# Patient Record
Sex: Female | Born: 1990 | Race: White | Hispanic: Yes | Marital: Married | Smoking: Never smoker
Health system: Southern US, Community
[De-identification: ages and names within clinical notes are randomized; demographics above are authoritative.]

## PROBLEM LIST (undated history)

## (undated) DIAGNOSIS — O24419 Gestational diabetes mellitus in pregnancy, unspecified control: Secondary | ICD-10-CM

## (undated) HISTORY — PX: NO PAST SURGERIES: SHX2092

---

## 2008-04-18 ENCOUNTER — Inpatient Hospital Stay (HOSPITAL_COMMUNITY): Admission: AD | Admit: 2008-04-18 | Discharge: 2008-04-18 | Payer: Self-pay | Admitting: Obstetrics & Gynecology

## 2008-06-13 ENCOUNTER — Ambulatory Visit (HOSPITAL_COMMUNITY): Admission: RE | Admit: 2008-06-13 | Discharge: 2008-06-13 | Payer: Self-pay | Admitting: Obstetrics & Gynecology

## 2008-10-19 ENCOUNTER — Ambulatory Visit: Payer: Self-pay | Admitting: Family Medicine

## 2008-10-19 ENCOUNTER — Inpatient Hospital Stay (HOSPITAL_COMMUNITY): Admission: AD | Admit: 2008-10-19 | Discharge: 2008-10-22 | Payer: Self-pay | Admitting: Obstetrics and Gynecology

## 2008-10-19 ENCOUNTER — Inpatient Hospital Stay (HOSPITAL_COMMUNITY): Admission: AD | Admit: 2008-10-19 | Discharge: 2008-10-19 | Payer: Self-pay | Admitting: Obstetrics & Gynecology

## 2009-08-31 ENCOUNTER — Emergency Department (HOSPITAL_COMMUNITY): Admission: EM | Admit: 2009-08-31 | Discharge: 2009-08-31 | Payer: Self-pay | Admitting: Emergency Medicine

## 2010-08-02 LAB — CBC
HCT: 35.1 % — ABNORMAL LOW (ref 36.0–46.0)
MCHC: 32.7 g/dL (ref 30.0–36.0)
MCV: 76.9 fL — ABNORMAL LOW (ref 78.0–100.0)
RBC: 4.57 MIL/uL (ref 3.87–5.11)

## 2011-01-28 LAB — URINALYSIS, ROUTINE W REFLEX MICROSCOPIC
Bilirubin Urine: NEGATIVE
Specific Gravity, Urine: 1.01 (ref 1.005–1.030)
Urobilinogen, UA: 0.2 mg/dL (ref 0.0–1.0)

## 2011-01-28 LAB — CBC
Hemoglobin: 13.5 g/dL (ref 12.0–15.0)
MCV: 81.9 fL (ref 78.0–100.0)
RBC: 4.95 MIL/uL (ref 3.87–5.11)
WBC: 9.2 10*3/uL (ref 4.0–10.5)

## 2011-01-28 LAB — URINE MICROSCOPIC-ADD ON

## 2011-01-28 LAB — POCT PREGNANCY, URINE: Preg Test, Ur: POSITIVE

## 2011-11-21 IMAGING — CT CT CERVICAL SPINE W/O CM
2 of 3 series · 8 of 14 positions shown, 9 images · non-contrast
Comparison: None.
COMPARISON: None

CLINICAL DATA: Headache.  Right facial and right arm numbness.

CT HEAD WITHOUT CONTRAST
TECHNIQUE: Contiguous axial images were obtained from the base of
the skull through the vertex without contrast
CLINICAL DATA: Headache.  Right face and arm numbness.
CT CERVICAL SPINE WITHOUT CONTRAST
TECHNIQUE: Multidetector CT imaging of the cervical spine was
performed without intravenous contrast.  Multiplanar CT image
reconstructions were also generated.

[Series 6: c_spine 2.0 b31s detail · axial · 0.30mm/px · z∈[+1026,+1134]mm · 4 of 92 slices shown]
[im 19/92  bone]
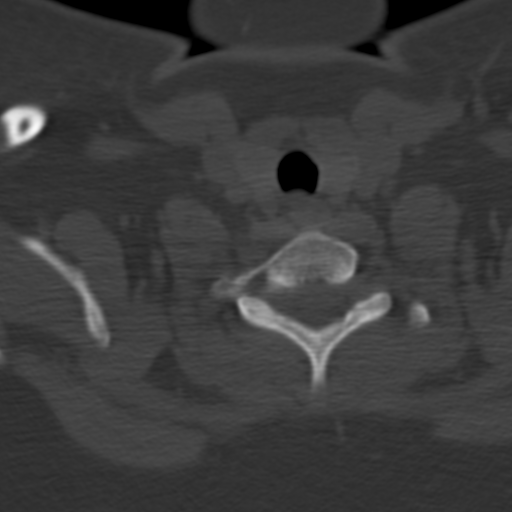
[im 37/92  bone]
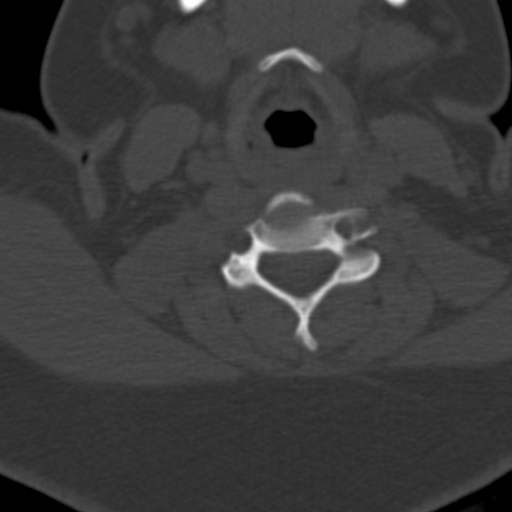
[im 55/92  bone]
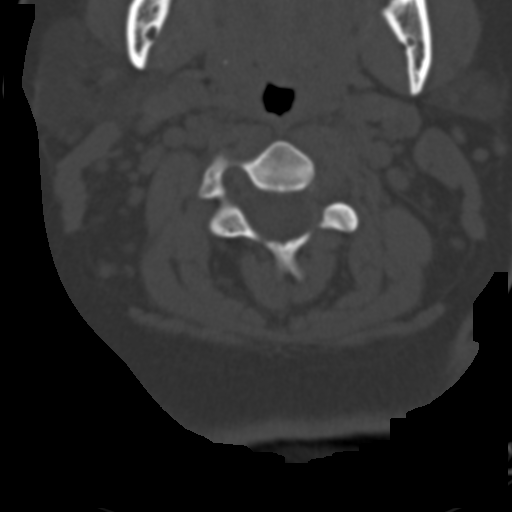
[im 73/92  bone]
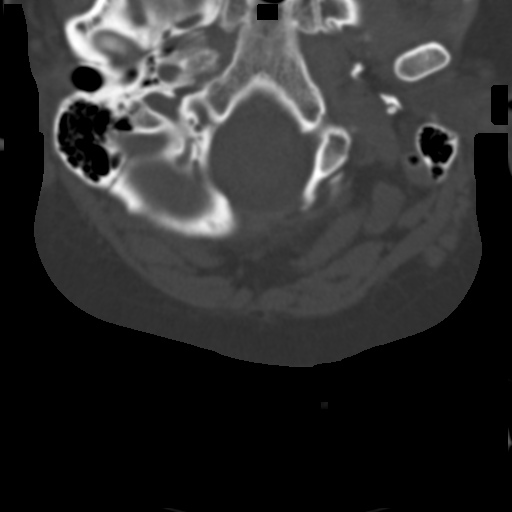

[Series 10: c_spine 2.0 spo thins · axial · 0.43mm/px · z∈[+980,+1069]mm · 4 of 83 slices shown, 5 images]
[im 17/83  soft-tissue]
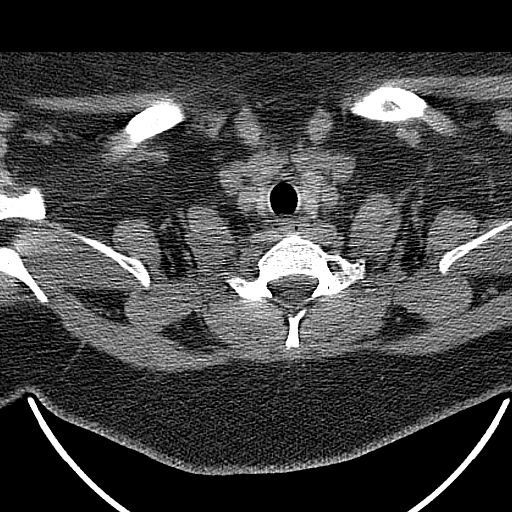
[im 17/83  bone]
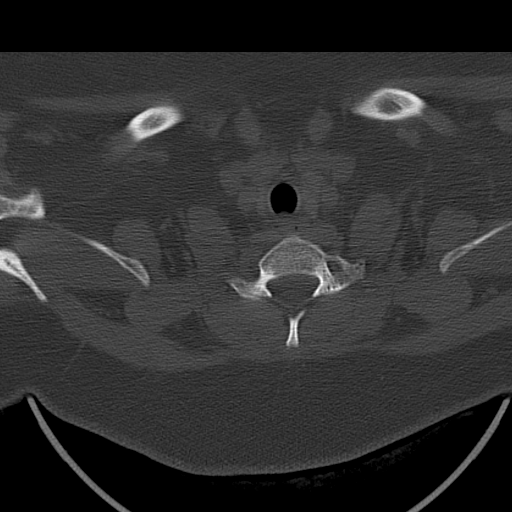
[im 33/83  bone]
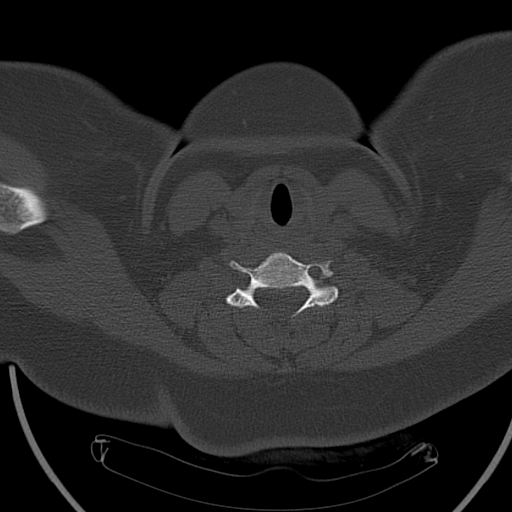
[im 50/83  bone]
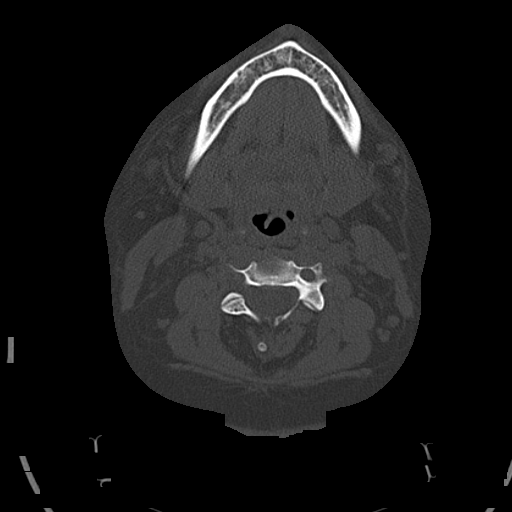
[im 66/83  bone]
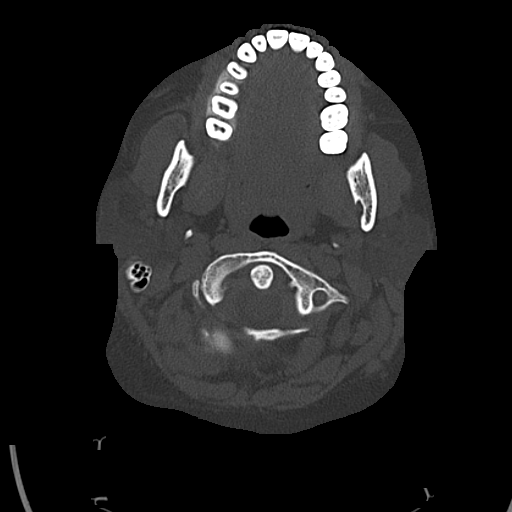

[8 of 14 positions shown; findings below may reference images not displayed]

FINDINGS: The brain has a normal appearance without evidence for
hemorrhage, acute infarction, hydrocephalus, or mass lesion.  There
is no extra axial fluid collection.  The skull and paranasal
sinuses are normal.
IMPRESSION: Normal CT of the head without contrast.
FINDINGS: There is straightening of the cervical lordosis with
mild kyphosis present.  Normal alignment.  Negative for fracture.
No significant degenerative change is identified.  There is no
fracture or mass lesion.
IMPRESSION: Negative

## 2012-07-31 ENCOUNTER — Encounter: Payer: Self-pay | Admitting: Obstetrics & Gynecology

## 2012-09-03 ENCOUNTER — Encounter: Payer: Self-pay | Admitting: Obstetrics & Gynecology

## 2013-11-14 ENCOUNTER — Ambulatory Visit: Payer: Self-pay

## 2014-01-01 ENCOUNTER — Ambulatory Visit (INDEPENDENT_AMBULATORY_CARE_PROVIDER_SITE_OTHER): Payer: Self-pay | Admitting: Gynecology

## 2014-01-01 ENCOUNTER — Encounter: Payer: Self-pay | Admitting: Gynecology

## 2014-01-01 VITALS — BP 112/76 | Ht 63.5 in | Wt 227.0 lb

## 2014-01-01 DIAGNOSIS — R51 Headache: Secondary | ICD-10-CM

## 2014-01-01 DIAGNOSIS — N912 Amenorrhea, unspecified: Secondary | ICD-10-CM

## 2014-01-01 DIAGNOSIS — R635 Abnormal weight gain: Secondary | ICD-10-CM

## 2014-01-01 DIAGNOSIS — O24419 Gestational diabetes mellitus in pregnancy, unspecified control: Secondary | ICD-10-CM | POA: Insufficient documentation

## 2014-01-01 DIAGNOSIS — L680 Hirsutism: Secondary | ICD-10-CM

## 2014-01-01 DIAGNOSIS — R519 Headache, unspecified: Secondary | ICD-10-CM | POA: Insufficient documentation

## 2014-01-01 LAB — HEMOGLOBIN A1C
Hgb A1c MFr Bld: 5.8 % — ABNORMAL HIGH (ref <5.7)
MEAN PLASMA GLUCOSE: 120 mg/dL — AB (ref <117)

## 2014-01-01 LAB — PREGNANCY, URINE: PREG TEST UR: NEGATIVE

## 2014-01-01 LAB — TSH: TSH: 4.475 u[IU]/mL (ref 0.350–4.500)

## 2014-01-01 MED ORDER — MEDROXYPROGESTERONE ACETATE 10 MG PO TABS: 10.0000 mg | ORAL_TABLET | Freq: Every day | ORAL | Status: DC

## 2014-01-01 NOTE — Progress Notes (Signed)
   The patient is a 23 year old gravida 1 para 1 who presents to the office today with a complaint of amenorrhea, weight gain, abnormal hair growth, and daily headaches. Patient stated that since the birth of her last child 5 years ago she has only had 5 menstrual cycles. She had been receiving [facility] but no blood tests her evaluation has ever been done and this is the reason for today's visit. She denies any galactorrhea or any double vision. She reports her Pap smear has been less than a year ago and was normal. She has not been using any form of contraception she was wanting to get pregnant shortly after the birth of her last child.  Exam: Patient weighs 227 pounds with a BMI of 39.58 blood pressure 112/76 HEENT: Patient with coarse hairs underneath her chin Pelvic: Bartholin urethra Skene glands within normal limits Vagina: No lesions or discharge Cervix no lesions or discharge Uterus upper limits of normal no palpable masses or tenderness Adnexa: No palpable mass or tenderness Rectal exam: Not done  Assessment/plan: Overweight patient with oligomenorrhea, daily headaches, and weight gain and hirsutism will be evaluated to rule out adrenal hyperplasia as well as hyperprolactinemia and/or hypothyroidism and diabetes. The following labs were ordered today: Urine pregnancy test (negative), hemoglobin A1c, TSH, serum prolactin level, DHEAS, total testosterone, and 17 hydroxyprogesterone. She will be prescribed Provera 10 mg to take 1 by mouth daily for 7 days. She will return back to the office in 2 weeks for followup unless these results are abnormal.

## 2014-01-02 LAB — TESTOSTERONE: TESTOSTERONE: 48 ng/dL (ref 10–70)

## 2014-01-02 LAB — PROLACTIN: PROLACTIN: 6.9 ng/mL

## 2014-01-04 LAB — DHEA: DHEA: 365 ng/dL (ref 102–1185)

## 2014-01-05 LAB — 17-HYDROXYPROGESTERONE: 17-OH-Progesterone, LC/MS/MS: 23 ng/dL

## 2014-01-14 ENCOUNTER — Ambulatory Visit: Payer: Self-pay | Admitting: Gynecology

## 2014-01-21 ENCOUNTER — Encounter: Payer: Self-pay | Admitting: Gynecology

## 2014-01-21 ENCOUNTER — Ambulatory Visit (INDEPENDENT_AMBULATORY_CARE_PROVIDER_SITE_OTHER): Payer: Self-pay | Admitting: Gynecology

## 2014-01-21 VITALS — BP 124/80

## 2014-01-21 DIAGNOSIS — N915 Oligomenorrhea, unspecified: Secondary | ICD-10-CM

## 2014-01-21 DIAGNOSIS — E282 Polycystic ovarian syndrome: Secondary | ICD-10-CM

## 2014-01-21 MED ORDER — CLOMIPHENE CITRATE 50 MG PO TABS: ORAL_TABLET | ORAL | Status: DC

## 2014-01-21 MED ORDER — MEDROXYPROGESTERONE ACETATE 10 MG PO TABS: 10.0000 mg | ORAL_TABLET | Freq: Every day | ORAL | Status: DC

## 2014-01-21 MED ORDER — METFORMIN HCL 500 MG PO TABS: 500.0000 mg | ORAL_TABLET | Freq: Every day | ORAL | Status: DC

## 2014-01-21 NOTE — Patient Instructions (Signed)
Metformin tablets Qu es este medicamento? La METFORMINA se usa para tratar la diabetes tipo 2. Ayuda a controlar el nivel de azcar en la sangre. El tratamiento se combina con ejercicios y una dieta. Este medicamento se puede usar solo o con otros medicamentos para la diabetes, incluyendo la insulina. Este medicamento puede ser utilizado para otros usos; si tiene alguna pregunta consulte con su proveedor de atencin mdica o con su farmacutico. MARCAS COMERCIALES DISPONIBLES: Glucophage Qu le debo informar a mi profesional de la salud antes de tomar este medicamento? Necesita saber si usted presenta alguno de los siguientes problemas o situaciones: -anemia -si consume bebidas alcohlicas con frecuencia -se deshidrata con facilidad -ataque cardiaco -insuficiencia cardiaca tratada con medicamentos -enfermedad renal -enfermedad heptica -ovarios poliqusticos -infeccin o lesin severa -vmito -una reaccin alrgica o inusual a la metformina, a otros medicamentos, alimentos, colorantes o conservantes -si est embarazada o buscando quedar embarazada -si est amamantando a un beb Cmo debo utilizar este medicamento? Tome este medicamento por va oral. Tmelo con las comidas. Trague las tabletas con un vaso de agua. Siga las instrucciones de la etiqueta del medicamento. Tome sus dosis a intervalos regulares. No tome su medicamento con una frecuencia mayor a la indicada. Hable con su pediatra para informarse acerca del uso de este medicamento en nios. Aunque este medicamento se puede recetar a nios tan menores como de 10 aos de edad para condiciones selectivas, las precauciones se aplican. Sobredosis: Pngase en contacto inmediatamente con un centro toxicolgico o una sala de urgencia si usted cree que haya tomado demasiado medicamento. ATENCIN: Este medicamento es solo para usted. No comparta este medicamento con nadie. Qu sucede si me olvido de una dosis? Si olvida una dosis, tmela  lo antes posible. Si es casi la hora de su dosis siguiente, tome slo esa dosis. No tome dosis adicionales o dobles. Qu puede interactuar con este medicamento? No tome esta medicina con ninguno de los siguientes medicamentos: -dofetilida -gatifloxacino -ciertos agentes de contraste administrados antes de un procedimiento con rayos X, tomografas computadas (CT), MRI u otros procedimientos Muchos medicamentos pueden aumentar o reducir el nivel de azcar en la sangre, tales como: -digoxina -diurticos -hormonas femeninas, como estrgenos, progestinas o pldoras anticonceptivas -isoniazida -medicamentos para presin sangunea, enfermedad cardiaca, pulso cardiaco irregular -morfina -cido nicotnico -fenotiazinas, tales como clorpromacina, mesoridazina, proclorperazina, tioridazina -fenitona -procainamida -quinidina -quinina -ranitidina -medicamentos esteroideos, como la prednisona o la cortisona -medicamentos estimulantes para trastornos de atencin, perder peso o mantenerse despierto -medicamentos tiroideos -trimetoprima -vancomicina Puede ser que esta lista no menciona todas las posibles interacciones. Informe a su profesional de la salud de todos los productos a base de hierbas, medicamentos de venta libre o suplementos nutritivos que est tomando. Si usted fuma, consume bebidas alcohlicas o si utiliza drogas ilegales, indqueselo tambin a su profesional de la salud. Algunas sustancias pueden interactuar con su medicamento. A qu debo estar atento al usar este medicamento? Visite a su mdico o a su profesional de la salud para chequear su evolucin peridicamente. Un examen llamado HbA1C (A1C) ser monitoreado. Es un simple examen de sangre. Mide su control de azcar en la sangre durante los ltimos 2 a 3 meses. Usted recibir este examen cada 3 a 6 meses. Aprenda cmo controlar el nivel de azcar en la sangre. Aprenda a reconocer los sntomas de bajo y alto nivel de azcar en la  sangre y cmo tratarlos. Siempre lleve consigo una fuente rpida de azcar por si acaso experimenta sntomas de bajo nivel de azcar en   la sangre. Ejemplos incluyen caramelos duros o tabletas de glucosa. Asegrese de que los miembros de su familia sepan que se puede ahogar si come o bebe mientras tiene sntomas graves de bajo nivel de azcar en la sangre, tales como convulsiones o prdida del conocimiento. Deben obtener ayuda mdica inmediatamente. Informe a su mdico o a su profesional de la salud si tiene alto nivel de azcar en la sangre. Tal vez sea necesario cambiar la dosis de su medicamento. Si est enfermo o haciendo mucho ms ejercicio que el habitual, puede ser necesario cambiar la dosis de su medicamento. No se salte comidas. Pregunte a su mdico o a su profesional de la salud si debe evitar el consumo de alcohol. Muchos productos de venta libre para tos y resfros contienen azcar y alcohol. Estos pueden afectar el nivel de azcar en la sangre. Este medicamento puede provocar la ovulacin en mujeres premenopusicas que no tienen periodos menstruales regulares. Esto puede aumentar la posibilidad de quedarse embarazada. No debe tomar este medicamento si se queda embarazada o si cree que est embarazada. Consulte a su mdico o su profesional de la salud sobre sus opciones anticonceptivas mientras est tomando este medicamento. Si cree que est embarazada, consulte a su mdico o su profesional de la salud inmediatamente. Si va a someterse a una operacin, IRM (MRI), tomografa computarizada u otro procedimiento, informe a su mdico que est tomando este medicamento. Usted podr necesitar dejar de tomar este medicamento antes del procedimiento. Use una pulsera o cadena de identificacin mdica. Lleve consigo una tarjeta de identificacin con informacin sobre su enfermedad y detalles de sus medicamentos y los horarios de las dosis. Qu efectos secundarios puedo tener al utilizar este  medicamento? Efectos secundarios que debe informar a su mdico o a su profesional de la salud tan pronto como sea posible: -reacciones alrgicas como erupcin cutnea, picazn o urticarias, hinchazn de la cara, labios o lengua -problemas respiratorios -sensacin de desmayos o aturdimiento, cadas -dolores o molestias musculares -signos o sntomas de bajo nivel de azcar en la sangre tales como sentirse ansioso, confusin, mareos, aumento de apetito, debilidad o cansancio inusual, sudoracin, temblores, fro, irritabilidad, dolor de cabeza, visin borrosa, pulso cardaco rpido, prdida del conocimiento -pulso cardiaco irregular o lento -molestias o dolor de estmago inusual -cansancio o debilidad inusual Efectos secundarios que, por lo general, no requieren atencin mdica (debe informarlos a su mdico o a su profesional de la salud si persisten o si son molestos): -diarrea -dolor de cabeza -acidez de estmago -sabor metlico en la boca -nuseas -molestias estomacales, gases Puede ser que esta lista no menciona todos los posibles efectos secundarios. Comunquese a su mdico por asesoramiento mdico sobre los efectos secundarios. Usted puede informar los efectos secundarios a la FDA por telfono al 1-800-FDA-1088. Dnde debo guardar mi medicina? Mantngala fuera del alcance de los nios. Gurdela a temperatura ambiente, entre 15 y 30 grados C (59 y 86 grados F). Protjala de la humedad y de la luz. Deseche todo el medicamento que no haya utilizado, despus de la fecha de vencimiento. ATENCIN: Este folleto es un resumen. Puede ser que no cubra toda la posible informacin. Si usted tiene preguntas acerca de esta medicina, consulte con su mdico, su farmacutico o su profesional de la salud.  2015, [employer]. (2013-02-27 16:15:26) Clomiphene tablets Qu es este medicamento? El CLOMIFENO es un medicamento para la fertilidad que se utiliza para aumentar la posibilidad de quedar  embarazada. Se usa para estimular una ovulacin (producir un huevo maduro) adecuada   durante el ciclo de la mujer. Este medicamento puede ser utilizado para otros usos; si tiene alguna pregunta consulte con su proveedor de atencin mdica o con su farmacutico. MARCAS COMERCIALES DISPONIBLES: Clomid, Serophene Qu le debo informar a mi profesional de la salud antes de tomar este medicamento? Necesita saber si usted presenta alguno de los siguientes problemas o situaciones: -enfermedad de la glndula suprarrenal -enfermedad vascular, trastorno de coagulacin sangunea -quiste en los ovarios -endometriosis -enfermedad heptica -carcinoma de ovario -enfermedad de la glndula pituitaria -sangrado vaginal que no ha sido evaluado -una reaccin alrgica o inusual al clomifeno, a otros medicamentos, alimentos, colorantes o conservantes -si est embarazada (no debe usar si est embarazada) -si est amamantando a un beb Cmo debo utilizar este medicamento? Tome este medicamento por va oral con un vaso de agua. Siga las instrucciones de la etiqueta del medicamento. Tomar exactamente segn se indica y durante el nmero exacto de das para los que fue recetado. Tome sus dosis a intervalos regulares. La mayora de las mujeres toman este medicamento durante un perodo de 5 das, pero la duracin del tratamiento puede ajustarse en algunos casos. Su mdico le indicarn el da en que debe empezar a tomar este medicamento y le darn las indicaciones para el seguimiento. No tome su medicamento con una frecuencia mayor que la indicada. Hable con su pediatra para informarse acerca del uso de este medicamento en nios. Puede requerir atencin especial. Sobredosis: Pngase en contacto inmediatamente con un centro toxicolgico o una sala de urgencia si usted cree que haya tomado demasiado medicamento. ATENCIN: Este medicamento es solo para usted. No comparta este medicamento con nadie. Qu sucede si me olvido de  una dosis? Si olvida una dosis, tmela lo antes posible. Si es casi la hora de la prxima dosis, tome slo esa dosis. No tome dosis adicionales o dobles. Qu puede interactuar con este medicamento? -suplementos a base de hierbas o dietticos como cohosh azul, cohosh negro, vitex o DHEA -prasterona Puede ser que esta lista no menciona todas las posibles interacciones. Informe a su profesional de la salud de todos los productos a base de hierbas, medicamentos de venta libre o suplementos nutritivos que est tomando. Si usted fuma, consume bebidas alcohlicas o si utiliza drogas ilegales, indqueselo tambin a su profesional de la salud. Algunas sustancias pueden interactuar con su medicamento. A qu debo estar atento al usar este medicamento? Asegrese que usted sepa cmo y cundo usar este medicamento. Debe saber cundo est ovulando y cundo debe tener relaciones sexuales a fin de incrementar las posibilidades de quedar embarazada. Visite a su mdico o a su profesional de la salud para chequear su evolucin peridicamente. Es posible que deba controlar sus niveles de hormonas en la sangre o que le indique alguna prueba de orina domiciliaria para controlar la ovulacin. Trate de no faltar a las citas. En comparacin con otros tratamientos para la fertilidad, este medicamento no aumenta mucho la posibilidad de tener un embarazo mltiple. Aproximadamente 5 de cada 100 mujeres que toman este medicamento tienen la posibilidad de quedar embarazadas con mellizos. Si piensa que est embarazada deje de tomar este medicamento de inmediato y comunquese con su mdico o con su profesional de la salud. Este medicamento no es para tratamientos a largo plazo. La mayora de las mujeres que se benefician del uso de este medicamento obtienen resultados dentro de los tres primeros ciclos (meses). Su mdico o su profesional de la salud volver a evaluar su problema. Este medicamento generalmente se utiliza durante no   ms  de 6 ciclos de tratamiento. Puede experimentar mareos o somnolencia. No conduzca ni utilice maquinaria ni haga nada que le exija permanecer en estado de alerta hasta que sepa cmo le afecta este medicamento. No se siente ni se ponga de pie con rapidez. Esto reduce el riesgo de mareos o desmayos. El consumir bebidas alcohlicas o el fumar tabaco puede disminuir las posibilidades de quedar embarazada. Limitar o dejar de consumir alcohol o el uso de tabaco durante su tratamiento para la fertilidad. Qu efectos secundarios puedo tener al utilizar este medicamento? Efectos secundarios que debe informar a su mdico o a su profesional de la salud tan pronto como sea posible: -reacciones alrgicas como erupcin cutnea, picazn o urticarias, hinchazn de la cara, labios o lengua -problemas respiratorios -cambios en la visin -retencin de lquidos -nuseas, vmito -hinchazn o dolor plvico -dolor abdominal severo -aumento de peso repentino Efectos secundarios que, por lo general, no requieren atencin mdica (debe informarlos a su mdico o a su profesional de la salud si persisten o si son molestos): -molestia en las mamas -sofocos -molestia plvica leve -nuseas leves Puede ser que esta lista no menciona todos los posibles efectos secundarios. Comunquese a su mdico por asesoramiento mdico sobre los efectos secundarios. Usted puede informar los efectos secundarios a la FDA por telfono al 1-800-FDA-1088. Dnde debo guardar mi medicina? Mantngala fuera del alcance de los nios. Gurdela a temperatura ambiente, entre 15 y 30 grados C (59 y 86 grados F). Protjala del calor, la luz y la humedad. Deseche todo el medicamento que no haya utilizado, despus de la fecha de vencimiento. ATENCIN: Este folleto es un resumen. Puede ser que no cubra toda la posible informacin. Si usted tiene preguntas acerca de esta medicina, consulte con su mdico, su farmacutico o su profesional de la salud.  2015,  [employer]. (2006-09-26 11:32:00)  

## 2014-01-21 NOTE — Progress Notes (Signed)
   The patient is a 23 year old gravida 1 para 1 who presented to the office today to discuss her test results from previous blood work that was ordered as a result of her oligomenorrhea and obesity (PCOS/hirutism), and secondary infertility. See previous note dated September 9 for additional details. Her lab results have been as follows:  Her nonfasting blood sugar was found to be at 120 over disease hemoglobin A1c 5.8) DHEA and 17 hydroxyprogesterone and total testosterone wall in the normal range. TSH and prolactin were also normal  After a negative pregnancy test was determined in our office she was prescribed Provera 10 mg which she took for 10 days and her cycles started 3 days after her last tablet.  We had an extensive discussion of the above findings. It appears that she has a PCOS type clinical picture contributing to her oligomenorrhea and hirsutism and now what appears to be type 2 diabetes. Patient wishes to conceive. We discussed ovulation induction medication but potential risks such as hyperstimulation and multiple gestation. We discussed potential side effects of metformin which she needs to be on because of her type 2 diabetes.  The plan will be as follows: Patient will begin taking prenatal vitamins 1 tablet daily Patient will be started on metformin 500 mg daily Patient does not start spontaneous menses within 30 days after her last cycle she will do a home urine pregnancy test. If negative she will take Provera 10 mg for 10 days to jump start her cycle and then from day 5 through 9 of her cycle she'll take clomiphene citrate 100 mg daily. From day 12 through 16 she will use the ovulation predictor kit to time her intercourse. She will return to the office on day 20 or 21 or 22 for a serum progesterone level. If she responds to this dose we will not need to repeat her blood test. She will do 3 cycles of the above regimen. If she does not conceive we will sit down and coordinate  scheduling an HSG as well as a semen analysis. All the above was discussed in Spanish in detail patient fully understands and accepts the risk.

## 2014-02-24 ENCOUNTER — Encounter: Payer: Self-pay | Admitting: Gynecology

## 2014-03-17 ENCOUNTER — Other Ambulatory Visit: Payer: Self-pay

## 2014-03-17 DIAGNOSIS — E282 Polycystic ovarian syndrome: Secondary | ICD-10-CM

## 2014-03-17 DIAGNOSIS — N915 Oligomenorrhea, unspecified: Secondary | ICD-10-CM

## 2014-03-18 LAB — PROGESTERONE: PROGESTERONE: 2.8 ng/mL

## 2014-04-03 ENCOUNTER — Ambulatory Visit (INDEPENDENT_AMBULATORY_CARE_PROVIDER_SITE_OTHER): Payer: Self-pay | Admitting: Women's Health

## 2014-04-03 ENCOUNTER — Ambulatory Visit (INDEPENDENT_AMBULATORY_CARE_PROVIDER_SITE_OTHER): Payer: Self-pay

## 2014-04-03 ENCOUNTER — Encounter: Payer: Self-pay | Admitting: Women's Health

## 2014-04-03 ENCOUNTER — Other Ambulatory Visit: Payer: Self-pay | Admitting: Women's Health

## 2014-04-03 VITALS — BP 118/80 | Ht 63.0 in | Wt 229.0 lb

## 2014-04-03 DIAGNOSIS — E282 Polycystic ovarian syndrome: Secondary | ICD-10-CM

## 2014-04-03 DIAGNOSIS — N912 Amenorrhea, unspecified: Secondary | ICD-10-CM

## 2014-04-03 DIAGNOSIS — Z3689 Encounter for other specified antenatal screening: Secondary | ICD-10-CM

## 2014-04-03 DIAGNOSIS — Z36 Encounter for antenatal screening of mother: Secondary | ICD-10-CM

## 2014-04-03 LAB — US OB TRANSVAGINAL

## 2014-04-03 LAB — PREGNANCY, URINE: PREG TEST UR: POSITIVE

## 2014-04-03 NOTE — Patient Instructions (Signed)
Primer trimestre de embarazo (First Trimester of Pregnancy) El primer trimestre de embarazo se extiende desde la semana1 hasta el final de la semana12 (mes1 al mes3). Una semana despus de que un espermatozoide fecunda un vulo, este se implantar en la pared uterina. Este embrin comenzar a desarrollarse hasta convertirse en un beb. Sus genes y los de su pareja forman el beb. Los genes del varn determinan si ser un nio o una nia. Entre la semana6 y la8, se forman los ojos y el rostro, y los latidos del corazn pueden verse en la ecografa. Al final de las 12semanas, todos los rganos del beb estn formados.  Ahora que est embarazada, querr hacer todo lo que est a su alcance para tener un beb sano. Dos de las cosas ms importantes son tener una buena atencin prenatal y seguir las indicaciones del mdico. La atencin prenatal incluye toda la asistencia mdica que usted recibe antes del nacimiento del beb. Esta ayudar a prevenir, detectar y tratar cualquier problema durante el embarazo y el parto. CAMBIOS EN EL ORGANISMO Su organismo atraviesa por muchos cambios durante el embarazo, y estos varan de una mujer a otra.   Al principio, puede aumentar o bajar algunos kilos.  Puede tener malestar estomacal (nuseas) y vomitar. Si no puede controlar los vmitos, llame al mdico.  Puede cansarse con facilidad.  Es posible que tenga dolores de cabeza que pueden aliviarse con los medicamentos que el mdico le permita tomar.  Puede orinar con mayor frecuencia. El dolor al orinar puede significar que usted tiene una infeccin de la vejiga.  Debido al embarazo, puede tener acidez estomacal.  Puede estar estreida, ya que ciertas hormonas enlentecen los movimientos de los msculos que empujan los desechos a travs de los intestinos.  Pueden aparecer hemorroides o abultarse e hincharse las venas (venas varicosas).  Las mamas pueden empezar a agrandarse y estar sensibles. Los pezones  pueden sobresalir ms, y el tejido que los rodea (areola) tornarse ms oscuro.  Las encas pueden sangrar y estar sensibles al cepillado y al hilo dental.  Pueden aparecer zonas oscuras o manchas (cloasma, mscara del embarazo) en el rostro que probablemente se atenuarn despus del nacimiento del beb.  Los perodos menstruales se interrumpirn.  Tal vez no tenga apetito.  Puede sentir un fuerte deseo de consumir ciertos alimentos.  Puede tener cambios a nivel emocional da a da, por ejemplo, por momentos puede estar emocionada por el embarazo y por otros preocuparse porque algo pueda salir mal con el embarazo o el beb.  Tendr sueos ms vvidos y extraos.  Tal vez haya cambios en el cabello que pueden incluir su engrosamiento, crecimiento rpido y cambios en la textura. A algunas mujeres tambin se les cae el cabello durante o despus del embarazo, o tienen el cabello seco o fino. Lo ms probable es que el cabello se le normalice despus del nacimiento del beb. QU DEBE ESPERAR EN LAS CONSULTAS PRENATALES Durante una visita prenatal de rutina:  La pesarn para asegurarse de que usted y el beb estn creciendo normalmente.  Le controlarn la presin arterial.  Le medirn el abdomen para controlar el desarrollo del beb.  Se escucharn los latidos cardacos a partir de la semana10 o la12 de embarazo, aproximadamente.  Se analizarn los resultados de los estudios solicitados en visitas anteriores. El mdico puede preguntarle:  Cmo se siente.  Si siente los movimientos del beb.  Si ha tenido sntomas anormales, como prdida de lquido, sangrado, dolores de cabeza intensos o   clicos abdominales.  Si tiene alguna pregunta. Otros estudios que pueden realizarse durante el primer trimestre incluyen lo siguiente:  Anlisis de sangre para determinar el tipo de sangre y detectar la presencia de infecciones previas. Adems, se los usar para controlar si los niveles de hierro  son bajos (anemia) y determinar los anticuerpos Rh. En una etapa ms avanzada del embarazo, se harn anlisis de sangre para saber si tiene diabetes, junto con otros estudios si surgen problemas.  Anlisis de orina para detectar infecciones, diabetes o protenas en la orina.  Una ecografa para confirmar que el beb crece y se desarrolla correctamente.  Una amniocentesis para diagnosticar posibles problemas genticos.  Estudios del feto para descartar espina bfida y sndrome de Down.  Es posible que necesite otras pruebas adicionales. INSTRUCCIONES PARA EL CUIDADO EN EL HOGAR  Medicamentos:  Siga las indicaciones del mdico en relacin con el uso de medicamentos. Durante el embarazo, hay medicamentos que pueden tomarse y otros que no.  Tome las vitaminas prenatales como se le indic.  Si est estreida, tome un laxante suave, si el mdico lo autoriza. Dieta  Consuma alimentos balanceados. Elija alimentos variados, como carne o protenas de origen vegetal, pescado, leche y productos lcteos descremados, verduras, frutas y panes y cereales integrales. El mdico la ayudar a determinar la cantidad de peso que puede aumentar.  No coma carne cruda ni quesos sin cocinar. Estos elementos contienen bacterias que pueden causar defectos congnitos en el beb.  La ingesta diaria de cuatro o cinco comidas pequeas en lugar de tres comidas abundantes puede ayudar a aliviar las nuseas y los vmitos. Si empieza a tener nuseas, comer algunas galletas saladas puede ser de ayuda. Beber lquidos entre las comidas en lugar de tomarlos durante las comidas tambin puede ayudar a calmar las nuseas y los vmitos.  Si est estreida, consuma alimentos con alto contenido de fibra, como verduras y frutas frescas, y cereales integrales. Beba suficiente lquido para mantener la orina clara o de color amarillo plido. Actividad y ejercicios  Haga ejercicio solamente como se lo haya indicado el mdico. El  ejercicio la ayudar a:  Controlar el peso.  Mantenerse en forma.  Estar preparada para el trabajo de parto y el parto.  Los dolores, los clicos en la parte baja del abdomen o los calambres en la cintura son un buen indicio de que debe dejar de hacer ejercicios. Consulte al mdico antes de seguir haciendo ejercicios normales.  Intente no estar de pie durante mucho tiempo. Mueva las piernas con frecuencia si debe estar de pie en un lugar durante mucho tiempo.  Evite levantar pesos excesivos.  Use zapatos de tacones bajos y mantenga una buena postura.  Puede seguir teniendo relaciones sexuales, excepto que el mdico le indique lo contrario. Alivio del dolor o las molestias  Use un sostn que le brinde buen soporte si siente dolor a la palpacin en las mamas.  Dese baos de asiento con agua tibia para aliviar el dolor o las molestias causadas por las hemorroides. Use crema antihemorroidal si el mdico se lo permite.  Descanse con las piernas elevadas si tiene calambres o dolor de cintura.  Si tiene venas varicosas en las piernas, use medias de descanso. Eleve los pies durante 15minutos, 3 o 4veces por da. Limite la cantidad de sal en su dieta. Cuidados prenatales  Programe las visitas prenatales para la semana12 de embarazo. Generalmente se programan cada mes al principio y se hacen ms frecuentes en los 2 ltimos meses antes   del parto.  Escriba sus preguntas. Llvelas cuando concurra a las visitas prenatales.  Concurra a todas las visitas prenatales como se lo haya indicado el mdico. Seguridad  Colquese el cinturn de seguridad cuando conduzca.  Haga una lista de los nmeros de telfono de emergencia, que incluya los nmeros de telfono de familiares, amigos, el hospital y los departamentos de polica y bomberos. Consejos generales  Pdale al mdico que la derive a clases de educacin prenatal en su localidad. Debe comenzar a tomar las clases antes de entrar en el mes6  de embarazo.  Pida ayuda si tiene necesidades nutricionales o de asesoramiento durante el embarazo. El mdico puede aconsejarla o derivarla a especialistas para que la ayuden con diferentes necesidades.  No se d baos de inmersin en agua caliente, baos turcos ni saunas.  No se haga duchas vaginales ni use tampones o toallas higinicas perfumadas.  No mantenga las piernas cruzadas durante mucho tiempo.  Evite el contacto con las bandejas sanitarias de los gatos y la tierra que estos animales usan. Estos elementos contienen bacterias que pueden causar defectos congnitos al beb y la posible prdida del feto debido a un aborto espontneo o muerte fetal.  No fume, no consuma hierbas ni medicamentos que no hayan sido recetados por el mdico. Las sustancias qumicas que estos productos contienen afectan la formacin y el desarrollo del beb.  Programe una cita con el dentista. En su casa, lvese los dientes con un cepillo dental blando y psese el hilo dental con suavidad. SOLICITE ATENCIN MDICA SI:   Tiene mareos.  Siente clicos leves, presin en la pelvis o dolor persistente en el abdomen.  Tiene nuseas, vmitos o diarrea persistentes.  Tiene secrecin vaginal con mal olor.  Siente dolor al orinar.  Tiene el rostro, las manos, las piernas o los tobillos ms hinchados. SOLICITE ATENCIN MDICA DE INMEDIATO SI:   Tiene fiebre.  Tiene una prdida de lquido por la vagina.  Tiene sangrado o pequeas prdidas vaginales.  Siente dolor intenso o clicos en el abdomen.  Sube o baja de peso rpidamente.  Vomita sangre de color rojo brillante o material que parezca granos de caf.  Ha estado expuesta a la rubola y no ha sufrido la enfermedad.  Ha estado expuesta a la quinta enfermedad o a la varicela.  Tiene un dolor de cabeza intenso.  Le falta el aire.  Sufre cualquier tipo de traumatismo, por ejemplo, debido a una cada o un accidente automovilstico. Document  Released: 01/19/2005 Document Revised: 08/26/2013 Exit[employer] Patient Information 2015 [employer]. This information is not intended to replace advice given to you by your health care provider. Make sure you discuss any questions you have with your health care provider.   

## 2014-04-03 NOTE — Progress Notes (Signed)
Patient ID: Bridget ScullOneyda Staszewski, female   DOB: 04/18/1991, 23 y.o.   MRN: [id number] Presents with home positive pregnancy test. History of PCOS and infertility seen by Dr. Lily PeerFernandez September 2015 was given Provera 10 mg daily for 10 days, had a cycle and took 5 days of Clomid after September cycle and October cycle. LMP October 23 normal 5 day cycle. Has 2 brothers both have  twins. Denies abdominal pain, vaginal discharge, bleeding. Interpreter present Leana RoeMarley Adams.  Ultrasound: Transvaginal retroverted uterus with IUP seen in fundus. Size less than dates by LMP. Gestational sac seen, no yolk sac, no fetal pole, no fetal heart tone. Right ovary normal numerous follicles. Left ovary thin-walled echo-free avascular cyst 30 x 26 mm. Cervix long and closed. Calcification seen in lower uterine segment 13 x 5 mm.  Clomid induced pregnancy/singleton S<D  Plan: Return to office in 2 weeks for viability/dating ultrasound. Aware we no longer deliver. Continue prenatal vitamins  and metformin 500 mg.daily. Safe pregnancy behaviors reviewed

## 2014-04-16 ENCOUNTER — Ambulatory Visit: Payer: Self-pay | Admitting: Women's Health

## 2014-04-16 ENCOUNTER — Other Ambulatory Visit: Payer: Self-pay

## 2014-04-16 ENCOUNTER — Other Ambulatory Visit: Payer: Self-pay | Admitting: Women's Health

## 2014-04-16 DIAGNOSIS — Z3689 Encounter for other specified antenatal screening: Secondary | ICD-10-CM

## 2014-04-16 DIAGNOSIS — N912 Amenorrhea, unspecified: Secondary | ICD-10-CM

## 2014-04-25 NOTE — L&D Delivery Note (Signed)
    Delivery Note At 5:15 PM a viable female was delivered via Vaginal, Spontaneous Delivery (Presentation: Left Occiput Anterior).  APGAR:9 ,9 ; weight pending  Placenta status: Intact, Spontaneous. Delayed cord clamping x 2 minutes. Cord: 3 vessels with the following complications: None.  Cord pH: N/A  Anesthesia: None  Episiotomy: None Lacerations: None Suture Repair: none Est. Blood Loss (mL): 50  Mom to postpartum.  Baby to Couplet care / Skin to Skin.  De Hollingsheadatherine L Doria Fern, D.O.  11/08/2014, 5:35 PM

## 2014-05-15 LAB — OB RESULTS CONSOLE HIV ANTIBODY (ROUTINE TESTING): HIV: NONREACTIVE

## 2014-05-15 LAB — OB RESULTS CONSOLE HGB/HCT, BLOOD
HCT: 39 %
HEMOGLOBIN: 12.5 g/dL

## 2014-05-15 LAB — OB RESULTS CONSOLE VARICELLA ZOSTER ANTIBODY, IGG: Varicella: IMMUNE

## 2014-05-15 LAB — SICKLE CELL SCREEN: SICKLE CELL SCREEN: NORMAL

## 2014-05-15 LAB — OB RESULTS CONSOLE HEPATITIS B SURFACE ANTIGEN: Hepatitis B Surface Ag: NEGATIVE

## 2014-05-15 LAB — OB RESULTS CONSOLE RUBELLA ANTIBODY, IGM: RUBELLA: IMMUNE

## 2014-05-15 LAB — OB RESULTS CONSOLE ABO/RH: RH Type: POSITIVE

## 2014-05-15 LAB — OB RESULTS CONSOLE GC/CHLAMYDIA
CHLAMYDIA, DNA PROBE: NEGATIVE
GC PROBE AMP, GENITAL: NEGATIVE

## 2014-05-15 LAB — OB RESULTS CONSOLE RPR: RPR: NONREACTIVE

## 2014-05-15 LAB — GLUCOSE TOLERANCE, 1 HOUR (50G) W/O FASTING: GLUCOSE 1 HOUR GTT: 153 mg/dL (ref ?–200)

## 2014-05-15 LAB — OB RESULTS CONSOLE ANTIBODY SCREEN: ANTIBODY SCREEN: NEGATIVE

## 2014-05-15 LAB — OB RESULTS CONSOLE PLATELET COUNT: PLATELETS: 277 10*3/uL

## 2014-05-15 LAB — CYTOLOGY - PAP: Pap: NEGATIVE

## 2014-05-26 LAB — GLUCOSE TOLERANCE, 3 HOURS
Glucose, GTT - 1 Hour: 140 mg/dL (ref ?–200)
Glucose, GTT - 2 Hour: 156 mg/dL — AB (ref ?–140)
Glucose, GTT - 3 Hour: 144 mg/dL — AB (ref ?–140)
Glucose, GTT - Fasting: 87 mg/dL (ref 80–110)

## 2014-06-02 ENCOUNTER — Ambulatory Visit: Payer: Self-pay | Admitting: Obstetrics & Gynecology

## 2014-06-02 ENCOUNTER — Encounter: Payer: Self-pay | Attending: Obstetrics & Gynecology | Admitting: *Deleted

## 2014-06-02 DIAGNOSIS — Z713 Dietary counseling and surveillance: Secondary | ICD-10-CM | POA: Insufficient documentation

## 2014-06-02 DIAGNOSIS — O24419 Gestational diabetes mellitus in pregnancy, unspecified control: Secondary | ICD-10-CM | POA: Insufficient documentation

## 2014-06-02 NOTE — Progress Notes (Signed)
  Patient was seen on 06/02/14 for Gestational Diabetes self-management . The following learning objectives were met by the patient :   States the definition of Gestational Diabetes  States when to check blood glucose levels  Demonstrates proper blood glucose monitoring techniques  States the effect of stress and exercise on blood glucose levels  Plan:  Consider  increasing your activity level by walking daily as tolerated Begin checking BG before breakfast and 1-2 hours after first bit of breakfast, lunch and dinner after  as directed by MD  Take medication  as directed by MD  Blood glucose monitor given: True Track Lot # [id number] Exp: 2016/06/22  Patient instructed to monitor glucose levels: FBS: 60 - <90 2 hour: <120  Patient received the following handouts:  Nutrition Diabetes and Pregnancy  Patient will be seen for follow-up as needed.

## 2014-06-04 ENCOUNTER — Encounter: Payer: Self-pay | Admitting: *Deleted

## 2014-06-05 ENCOUNTER — Ambulatory Visit: Payer: Self-pay | Admitting: *Deleted

## 2014-06-05 VITALS — Wt 211.9 lb

## 2014-06-05 DIAGNOSIS — O24410 Gestational diabetes mellitus in pregnancy, diet controlled: Secondary | ICD-10-CM

## 2014-06-05 NOTE — Progress Notes (Signed)
Nutrition note: GDM diet education Pt has h/o obesity & has GDM. Pt has lost ~18.1# @ 3166w1d. Pt reports she has a lot of N/V that has improved but she still vomits after lunch &/or dinner some days. Pt reports eating 3 meals & 2-3 snacks/d. Pt received her meter on Monday & has been checking her BS- fasting: 61-78; 2hr pp: 76-112 (all wnl). Pt is taking a PNV. Pt has been walking 1-2x/d for ~30 mins. Pt received verbal & written education in Spanish via an interpreter about GDM diet. Discussed tips to decrease N/V & to talk with provider if it continues. Discussed wt gain goals of 11-20# or 0.5#/wk. Pt agrees to follow GDM diet with 3 meals & 1-3 snacks/d with proper CHO/ protein combination. Pt has WIC & plans to BF/pump. F/u in 2-4 wks Blondell RevealLaura Fynn Adel, MS, RD, LDN, [facility]

## 2014-06-16 ENCOUNTER — Encounter: Payer: Self-pay | Admitting: Obstetrics and Gynecology

## 2014-06-16 ENCOUNTER — Ambulatory Visit (INDEPENDENT_AMBULATORY_CARE_PROVIDER_SITE_OTHER): Payer: Self-pay | Admitting: Obstetrics and Gynecology

## 2014-06-16 VITALS — BP 107/59 | HR 75 | Temp 98.0°F | Wt 209.1 lb

## 2014-06-16 DIAGNOSIS — O0992 Supervision of high risk pregnancy, unspecified, second trimester: Secondary | ICD-10-CM | POA: Insufficient documentation

## 2014-06-16 DIAGNOSIS — O24419 Gestational diabetes mellitus in pregnancy, unspecified control: Secondary | ICD-10-CM

## 2014-06-16 LAB — POCT URINALYSIS DIP (DEVICE)
Bilirubin Urine: NEGATIVE
Glucose, UA: NEGATIVE mg/dL
Hgb urine dipstick: NEGATIVE
KETONES UR: NEGATIVE mg/dL
Nitrite: NEGATIVE
Protein, ur: NEGATIVE mg/dL
SPECIFIC GRAVITY, URINE: 1.025 (ref 1.005–1.030)
Urobilinogen, UA: 1 mg/dL (ref 0.0–1.0)
pH: 6 (ref 5.0–8.0)

## 2014-06-16 LAB — COMPLETE METABOLIC PANEL WITH GFR
ALT: 15 U/L (ref 0–35)
AST: 13 U/L (ref 0–37)
Albumin: 3.7 g/dL (ref 3.5–5.2)
Alkaline Phosphatase: 48 U/L (ref 39–117)
BUN: 8 mg/dL (ref 6–23)
CHLORIDE: 104 meq/L (ref 96–112)
CO2: 21 mEq/L (ref 19–32)
Calcium: 9.1 mg/dL (ref 8.4–10.5)
Creat: 0.52 mg/dL (ref 0.50–1.10)
GFR, Est African American: >89 mL/min
GFR, Est Non African American: >89 mL/min
GLUCOSE: 75 mg/dL (ref 70–99)
Potassium: 3.7 mEq/L (ref 3.5–5.3)
Sodium: 136 mEq/L (ref 135–145)
TOTAL PROTEIN: 6.5 g/dL (ref 6.0–8.3)
Total Bilirubin: 0.2 mg/dL (ref 0.2–1.2)

## 2014-06-16 LAB — CBC
HEMATOCRIT: 38.1 % (ref 36.0–46.0)
Hemoglobin: 12.5 g/dL (ref 12.0–15.0)
MCH: 26 pg (ref 26.0–34.0)
MCHC: 32.8 g/dL (ref 30.0–36.0)
MCV: 79.4 fL (ref 78.0–100.0)
MPV: 10.8 fL (ref 8.6–12.4)
Platelets: 290 10*3/uL (ref 150–400)
RBC: 4.8 MIL/uL (ref 3.87–5.11)
RDW: 14 % (ref 11.5–15.5)
WBC: 8.9 10*3/uL (ref 4.0–10.5)

## 2014-06-16 LAB — HEMOGLOBIN A1C
Hgb A1c MFr Bld: 5.3 % (ref <5.7)
Mean Plasma Glucose: 105 mg/dL (ref <117)

## 2014-06-16 LAB — TSH: TSH: 3.057 u[IU]/mL (ref 0.350–4.500)

## 2014-06-16 MED ORDER — ASPIRIN 81 MG PO TABS: 81.0000 mg | ORAL_TABLET | Freq: Every day | ORAL | Status: DC

## 2014-06-16 MED ORDER — GLYBURIDE 5 MG PO TABS: 2.5000 mg | ORAL_TABLET | Freq: Every day | ORAL | Status: DC

## 2014-06-16 NOTE — Progress Notes (Signed)
Anatomy U/S 07/04/14 @ 3p.  Spanish interpreter Darel HongMariaElena Jimenez present.

## 2014-06-16 NOTE — Progress Notes (Signed)
G2P1001 at 7266w5d. Transfer from HD due to GDM. Doing well today. No concerns or complaints. 1. GDM. Abnormal early 3 hour GTT. Baseline labs including CBC, CMP, TSH, a1c ordered today. Reviewed blood glucose log today. >50% AM fasting blood sugars above goal, only 3 postprandials above goal. Start glyburide 2.5 mg nightly. Continue checking blood sugars as directed.  2. Routine PNC. Lab work reviewed. Pap smear completed at health department. Anatomy ultrasound ordered today.

## 2014-06-16 NOTE — Assessment & Plan Note (Signed)
  [facility]  Dating  Blood type: A/Positive/-- (01/21 0000)   Genetic Screen 1 Screen:    AFP:     Quad:     NIPS: Antibody:Negative (01/21 0000)  Anatomic US  Rubella: Immune (01/21 0000)  GTT Early:               Third trimester:  RPR: Nonreactive (01/21 0000)   Flu vaccine  HBsAg: Negative (01/21 0000)   TDaP vaccine                                               Rhogam: HIV: Non-reactive (01/21 0000)   GBS                                              (For PCN allergy, check sensitivities) GBS:   Contraception  Pap:  Baby Food    Circumcision    Pediatrician    Support Person

## 2014-06-20 LAB — PROTEIN, URINE, 24 HOUR
PROTEIN 24H UR: 135 mg/d (ref <150)
PROTEIN, URINE: 9 mg/dL (ref 5–24)

## 2014-06-30 ENCOUNTER — Encounter: Payer: Self-pay | Admitting: Obstetrics and Gynecology

## 2014-06-30 ENCOUNTER — Ambulatory Visit (INDEPENDENT_AMBULATORY_CARE_PROVIDER_SITE_OTHER): Payer: Self-pay | Admitting: Obstetrics and Gynecology

## 2014-06-30 VITALS — BP 119/59 | HR 79 | Temp 98.2°F | Wt 208.0 lb

## 2014-06-30 DIAGNOSIS — O0992 Supervision of high risk pregnancy, unspecified, second trimester: Secondary | ICD-10-CM

## 2014-06-30 DIAGNOSIS — O24419 Gestational diabetes mellitus in pregnancy, unspecified control: Secondary | ICD-10-CM

## 2014-06-30 LAB — POCT URINALYSIS DIP (DEVICE)
Bilirubin Urine: NEGATIVE
GLUCOSE, UA: NEGATIVE mg/dL
Hgb urine dipstick: NEGATIVE
KETONES UR: 15 mg/dL — AB
Nitrite: NEGATIVE
Protein, ur: NEGATIVE mg/dL
SPECIFIC GRAVITY, URINE: 1.02 (ref 1.005–1.030)
Urobilinogen, UA: 0.2 mg/dL (ref 0.0–1.0)
pH: 6 (ref 5.0–8.0)

## 2014-06-30 NOTE — Progress Notes (Signed)
Patient is doing well today. She reports a syncopal episode while at church yesterday. She did not come to the hospital because she wanted to stay at church. This is the first time such an episode happened to her. Advised to come to the hospital if this recurs. She reports some cramping pains on occasions and the presence of a white discharge. Wet prep collected. Urine culture also obtained as the patient reports frequency. Follow up anatomy ultrasound scheduled on 3/11. CBGs reviewed and all within range. Continue glyburide

## 2014-06-30 NOTE — Progress Notes (Signed)
Bridget Baird used for interpreter; patient reports abdominal/pelvic pain for past three days as well as back pain. Also reports urinary frequency Reports syncopal episode yesterday. States she was standing and all of a sudden she lost hearing, couldn't see and then passed out for like 5 minutes. Also reports d/c with odor

## 2014-07-01 LAB — WET PREP, GENITAL
Trich, Wet Prep: NONE SEEN
Yeast Wet Prep HPF POC: NONE SEEN

## 2014-07-01 MED ORDER — METRONIDAZOLE 500 MG PO TABS: 500.0000 mg | ORAL_TABLET | Freq: Two times a day (BID) | ORAL | Status: DC

## 2014-07-01 NOTE — Addendum Note (Signed)
Addended by: Catalina AntiguaONSTANT, [place] Flett on: 07/01/2014 09:28 PM   Modules accepted: Orders

## 2014-07-02 ENCOUNTER — Telehealth: Payer: Self-pay | Admitting: General Practice

## 2014-07-02 LAB — CULTURE, OB URINE: Colony Count: >=100000

## 2014-07-02 NOTE — Telephone Encounter (Signed)
Called patient with Bridget Baird for interpreter and informed patient of results and recommendations. Patient verbalized understanding and had no questions

## 2014-07-02 NOTE — Telephone Encounter (Signed)
-----   Message from Catalina AntiguaPeggy Constant, MD sent at 07/01/2014  9:28 PM EST ----- Please inform patient of positive BV- flagyl e-precribed  peggy

## 2014-07-04 ENCOUNTER — Encounter (HOSPITAL_COMMUNITY): Payer: Self-pay

## 2014-07-04 ENCOUNTER — Ambulatory Visit (HOSPITAL_COMMUNITY)
Admission: RE | Admit: 2014-07-04 | Discharge: 2014-07-04 | Disposition: A | Discharge status: Home / Self Care | Payer: Self-pay | Source: Ambulatory Visit | Attending: Obstetrics and Gynecology | Admitting: Obstetrics and Gynecology

## 2014-07-04 DIAGNOSIS — O0992 Supervision of high risk pregnancy, unspecified, second trimester: Secondary | ICD-10-CM

## 2014-07-04 DIAGNOSIS — Z36 Encounter for antenatal screening of mother: Secondary | ICD-10-CM | POA: Insufficient documentation

## 2014-07-04 DIAGNOSIS — E119 Type 2 diabetes mellitus without complications: Secondary | ICD-10-CM | POA: Insufficient documentation

## 2014-07-04 DIAGNOSIS — O24419 Gestational diabetes mellitus in pregnancy, unspecified control: Secondary | ICD-10-CM

## 2014-07-04 DIAGNOSIS — Z3689 Encounter for other specified antenatal screening: Secondary | ICD-10-CM | POA: Insufficient documentation

## 2014-07-04 DIAGNOSIS — O09292 Supervision of pregnancy with other poor reproductive or obstetric history, second trimester: Secondary | ICD-10-CM | POA: Insufficient documentation

## 2014-07-04 DIAGNOSIS — O24112 Pre-existing diabetes mellitus, type 2, in pregnancy, second trimester: Secondary | ICD-10-CM | POA: Insufficient documentation

## 2014-07-04 DIAGNOSIS — Z3A18 18 weeks gestation of pregnancy: Secondary | ICD-10-CM | POA: Insufficient documentation

## 2014-07-07 ENCOUNTER — Other Ambulatory Visit (HOSPITAL_COMMUNITY): Payer: Self-pay | Admitting: Maternal and Fetal Medicine

## 2014-07-07 DIAGNOSIS — Z3A28 28 weeks gestation of pregnancy: Secondary | ICD-10-CM

## 2014-07-07 DIAGNOSIS — O24312 Unspecified pre-existing diabetes mellitus in pregnancy, second trimester: Secondary | ICD-10-CM

## 2014-07-07 DIAGNOSIS — O09299 Supervision of pregnancy with other poor reproductive or obstetric history, unspecified trimester: Secondary | ICD-10-CM

## 2014-07-14 ENCOUNTER — Ambulatory Visit (INDEPENDENT_AMBULATORY_CARE_PROVIDER_SITE_OTHER): Payer: Self-pay | Admitting: Family Medicine

## 2014-07-14 VITALS — BP 100/54 | HR 70 | Temp 98.0°F | Wt 206.8 lb

## 2014-07-14 DIAGNOSIS — O0992 Supervision of high risk pregnancy, unspecified, second trimester: Secondary | ICD-10-CM

## 2014-07-14 DIAGNOSIS — O24419 Gestational diabetes mellitus in pregnancy, unspecified control: Secondary | ICD-10-CM

## 2014-07-14 LAB — POCT URINALYSIS DIP (DEVICE)
BILIRUBIN URINE: NEGATIVE
Glucose, UA: NEGATIVE mg/dL
Hgb urine dipstick: NEGATIVE
Ketones, ur: 15 mg/dL — AB
Nitrite: NEGATIVE
PH: 7 (ref 5.0–8.0)
PROTEIN: NEGATIVE mg/dL
Specific Gravity, Urine: 1.02 (ref 1.005–1.030)
Urobilinogen, UA: 1 mg/dL (ref 0.0–1.0)

## 2014-07-14 NOTE — Progress Notes (Signed)
Patient is 24 y.o. G2P1001 4317w3d.  +FM, denies LOF, VB, contractions, vaginal discharge.  Overall feeling well. Fasting: 2/20 out of range at 92 94 2h pp bfast: 12/12 nml 2h PP lunch: 2/19 out of range  At 122 130 2h PP dinner: 13/13 nml => well controlled on glyburide 2.5mg  qHS - referred for fetal ECHO and to ophtho

## 2014-07-14 NOTE — Progress Notes (Signed)
Optho appt with Dr. Karleen HampshireSpencer 08/11/14 @ 11a pt informed of cost $80-$100.00.  Fetal echo with Dr. Elizebeth Brookingotton 08/14/14 @ 130p, pt informed $100.00 deposit due at appt and will be billed for remaining cost.  Pt aware followup U/S 09/12/14 @ 930a.

## 2014-08-04 ENCOUNTER — Ambulatory Visit (INDEPENDENT_AMBULATORY_CARE_PROVIDER_SITE_OTHER): Payer: Self-pay | Admitting: Obstetrics and Gynecology

## 2014-08-04 VITALS — BP 104/52 | HR 66 | Temp 97.7°F | Wt 205.3 lb

## 2014-08-04 DIAGNOSIS — O24419 Gestational diabetes mellitus in pregnancy, unspecified control: Secondary | ICD-10-CM

## 2014-08-04 DIAGNOSIS — O0992 Supervision of high risk pregnancy, unspecified, second trimester: Secondary | ICD-10-CM

## 2014-08-04 LAB — POCT URINALYSIS DIP (DEVICE)
Bilirubin Urine: NEGATIVE
GLUCOSE, UA: NEGATIVE mg/dL
Ketones, ur: NEGATIVE mg/dL
Nitrite: NEGATIVE
PH: 6.5 (ref 5.0–8.0)
Protein, ur: NEGATIVE mg/dL
SPECIFIC GRAVITY, URINE: 1.025 (ref 1.005–1.030)
UROBILINOGEN UA: 1 mg/dL (ref 0.0–1.0)

## 2014-08-04 NOTE — Progress Notes (Signed)
24 y.o. G2P1001 at 164w3d here for routine OB visit. Doing well. No concerns.  1. A2GDM. Had been well controlled but forgot blood glucose log today. Continues to take glyburide 2.5 mg at night. Continue diet/exercise. Stressed the importance of bring blood glucose log to clinic. Follow up growth scan scheduled for 28 weeks.    2. Routine PNC. Labs reviewed. FM/PTL precautions reviewed. RTC 2 weeks.

## 2014-08-04 NOTE — Progress Notes (Signed)
States she forgot blood glucose log.

## 2014-08-18 ENCOUNTER — Encounter: Payer: Self-pay | Admitting: Family Medicine

## 2014-08-25 ENCOUNTER — Ambulatory Visit (INDEPENDENT_AMBULATORY_CARE_PROVIDER_SITE_OTHER): Payer: Self-pay | Admitting: Family Medicine

## 2014-08-25 ENCOUNTER — Telehealth: Payer: Self-pay | Admitting: General Practice

## 2014-08-25 VITALS — BP 93/48 | HR 68 | Temp 98.0°F | Wt 205.6 lb

## 2014-08-25 DIAGNOSIS — Z23 Encounter for immunization: Secondary | ICD-10-CM

## 2014-08-25 DIAGNOSIS — O0992 Supervision of high risk pregnancy, unspecified, second trimester: Secondary | ICD-10-CM

## 2014-08-25 LAB — CBC
HEMATOCRIT: 34.9 % — AB (ref 36.0–46.0)
Hemoglobin: 11.2 g/dL — ABNORMAL LOW (ref 12.0–15.0)
MCH: 26.1 pg (ref 26.0–34.0)
MCHC: 32.1 g/dL (ref 30.0–36.0)
MCV: 81.4 fL (ref 78.0–100.0)
MPV: 10.2 fL (ref 8.6–12.4)
Platelets: 273 10*3/uL (ref 150–400)
RBC: 4.29 MIL/uL (ref 3.87–5.11)
RDW: 14.7 % (ref 11.5–15.5)
WBC: 8.7 10*3/uL (ref 4.0–10.5)

## 2014-08-25 LAB — POCT URINALYSIS DIP (DEVICE)
Bilirubin Urine: NEGATIVE
GLUCOSE, UA: NEGATIVE mg/dL
Hgb urine dipstick: NEGATIVE
KETONES UR: NEGATIVE mg/dL
NITRITE: NEGATIVE
Protein, ur: 30 mg/dL — AB
SPECIFIC GRAVITY, URINE: 1.025 (ref 1.005–1.030)
UROBILINOGEN UA: 1 mg/dL (ref 0.0–1.0)
pH: 7 (ref 5.0–8.0)

## 2014-08-25 MED ORDER — TETANUS-DIPHTH-ACELL PERTUSSIS 5-2.5-18.5 LF-MCG/0.5 IM SUSP
0.5000 mL | Freq: Once | INTRAMUSCULAR | Status: AC
Start: 2014-08-25 — End: 2014-08-25
  Administered 2014-08-25: 0.5 mL via INTRAMUSCULAR

## 2014-08-25 NOTE — Progress Notes (Signed)
Patient is 24 y.o. G2P1001 4532w3d.  +FM, denies VB. Overall feeling well. A2/BDM: very well controlled, all within range, continue glyburide 2.5mg  with dinner.  - fetal ECHO: missed appointment but has been rescheduled  - referred to ophtho

## 2014-08-25 NOTE — Progress Notes (Signed)
Interpreter Elna Breslowarol Laurent  Edema- ankles  28 wk packet given  Tdap vaccine consented and info given

## 2014-08-25 NOTE — Progress Notes (Signed)
Leukocytes: Moderate 

## 2014-08-25 NOTE — Telephone Encounter (Signed)
[facility] Made appt for 5/26 @ 930. Called patient, no answer- left message stating we are trying to reach you with an appt, please call us back at the clinics

## 2014-08-26 LAB — RPR

## 2014-08-26 LAB — HIV ANTIBODY (ROUTINE TESTING W REFLEX): HIV 1&2 Ab, 4th Generation: NONREACTIVE

## 2014-08-26 NOTE — Telephone Encounter (Signed)
Called patient and informed her of appt. Patient verbalized understanding and had no questions

## 2014-09-12 ENCOUNTER — Other Ambulatory Visit (HOSPITAL_COMMUNITY): Payer: Self-pay | Admitting: Maternal and Fetal Medicine

## 2014-09-12 ENCOUNTER — Encounter (HOSPITAL_COMMUNITY): Payer: Self-pay

## 2014-09-12 ENCOUNTER — Ambulatory Visit (HOSPITAL_COMMUNITY)
Admission: RE | Admit: 2014-09-12 | Discharge: 2014-09-12 | Disposition: A | Discharge status: Home / Self Care | Payer: Self-pay | Source: Ambulatory Visit | Attending: Obstetrics and Gynecology | Admitting: Obstetrics and Gynecology

## 2014-09-12 DIAGNOSIS — Z3A28 28 weeks gestation of pregnancy: Secondary | ICD-10-CM

## 2014-09-12 DIAGNOSIS — O09299 Supervision of pregnancy with other poor reproductive or obstetric history, unspecified trimester: Secondary | ICD-10-CM

## 2014-09-12 DIAGNOSIS — O24312 Unspecified pre-existing diabetes mellitus in pregnancy, second trimester: Secondary | ICD-10-CM

## 2014-09-12 DIAGNOSIS — O99213 Obesity complicating pregnancy, third trimester: Secondary | ICD-10-CM | POA: Insufficient documentation

## 2014-09-12 DIAGNOSIS — O09293 Supervision of pregnancy with other poor reproductive or obstetric history, third trimester: Secondary | ICD-10-CM | POA: Insufficient documentation

## 2014-09-12 DIAGNOSIS — E119 Type 2 diabetes mellitus without complications: Secondary | ICD-10-CM | POA: Insufficient documentation

## 2014-09-12 DIAGNOSIS — Z36 Encounter for antenatal screening of mother: Secondary | ICD-10-CM | POA: Insufficient documentation

## 2014-09-12 DIAGNOSIS — O24313 Unspecified pre-existing diabetes mellitus in pregnancy, third trimester: Secondary | ICD-10-CM | POA: Insufficient documentation

## 2014-09-12 HISTORY — DX: Gestational diabetes mellitus in pregnancy, unspecified control: O24.419

## 2014-09-15 ENCOUNTER — Encounter: Payer: Self-pay | Admitting: Obstetrics and Gynecology

## 2014-09-29 ENCOUNTER — Encounter: Payer: Self-pay | Admitting: Family Medicine

## 2014-10-13 ENCOUNTER — Encounter: Payer: Self-pay | Admitting: Obstetrics & Gynecology

## 2014-10-23 ENCOUNTER — Ambulatory Visit (INDEPENDENT_AMBULATORY_CARE_PROVIDER_SITE_OTHER): Payer: Self-pay | Admitting: Family Medicine

## 2014-10-23 VITALS — BP 106/60 | HR 74 | Temp 97.9°F | Wt 208.9 lb

## 2014-10-23 DIAGNOSIS — O24419 Gestational diabetes mellitus in pregnancy, unspecified control: Secondary | ICD-10-CM

## 2014-10-23 DIAGNOSIS — O0992 Supervision of high risk pregnancy, unspecified, second trimester: Secondary | ICD-10-CM

## 2014-10-23 DIAGNOSIS — O0933 Supervision of pregnancy with insufficient antenatal care, third trimester: Secondary | ICD-10-CM

## 2014-10-23 LAB — POCT URINALYSIS DIP (DEVICE)
Bilirubin Urine: NEGATIVE
GLUCOSE, UA: NEGATIVE mg/dL
Ketones, ur: NEGATIVE mg/dL
Nitrite: NEGATIVE
Protein, ur: 30 mg/dL — AB
Specific Gravity, Urine: 1.025 (ref 1.005–1.030)
Urobilinogen, UA: 1 mg/dL (ref 0.0–1.0)
pH: 7 (ref 5.0–8.0)

## 2014-10-23 LAB — OB RESULTS CONSOLE GBS: GBS: NEGATIVE

## 2014-10-23 MED ORDER — GLYBURIDE 5 MG PO TABS: 2.5000 mg | ORAL_TABLET | Freq: Every day | ORAL | Status: DC

## 2014-10-23 NOTE — Progress Notes (Signed)
Subjective:  Bridget Baird is a 24 y.o. G2P1001 at 183w6d being seen today for ongoing prenatal care.  Patient reports no complaints.  Contractions: Not present.  Vag. Bleeding: None. Movement: Present. Denies leaking of fluid.   Class B diabetes.  Has not been to clinic for 6 weeks - her father has hospitalized and went to [place] help care for him.  Lost her glucometer and log book.  Has not been checking her blood sugars, but has continued with taking her glyburide.  The following portions of the patient's history were reviewed and updated as appropriate: allergies, current medications, past family history, past medical history, past social history, past surgical history and problem list.   Objective:   Filed Vitals:   10/23/14 0924  BP: 106/60  Pulse: 74  Temp: 97.9 F (36.6 C)  Weight: 208 lb 14.4 oz (94.756 kg)    Fetal Status: Fetal Heart Rate (bpm): 141   Movement: Present     General:  Alert, oriented and cooperative. Patient is in no acute distress.  Skin: Skin is warm and dry. No rash noted.   Cardiovascular: Normal heart rate noted  Respiratory: Normal respiratory effort, no problems with respiration noted  Abdomen: Soft, gravid, appropriate for gestational age. Pain/Pressure: Absent     Vaginal: Vag. Bleeding: None.       Cervix: Not evaluated        Extremities: Normal range of motion.  Edema: None  Mental Status: Normal mood and affect. Normal behavior. Normal judgment and thought content.   Urinalysis:      Assessment and Plan:  Pregnancy: G2P1001 at 7483w6d  1. Supervision of high risk pregnancy in second trimester FHR normal - GC/Chlamydia Probe Amp - Culture, beta strep (group b only)  2. Gestational diabetes mellitus, antepartum NST today and twice weekly NST reactive today. Replaced glucometer and log book F/u US for growth tomorrow. - glyBURIDE (DIABETA) 5 MG tablet; Take 0.5 tablets (2.5 mg total) by mouth at bedtime.  Dispense: 30 tablet;  Refill: 1  3. Insufficient prenatal care in third trimester Will start twice weekly testing.   Term labor symptoms and general obstetric precautions including but not limited to vaginal bleeding, contractions, leaking of fluid and fetal movement were reviewed in detail with the patient.  Please refer to After Visit Summary for other counseling recommendations.   No Follow-up on file.   Levie HeritageJacob J Danyal Adorno, DO

## 2014-10-24 ENCOUNTER — Ambulatory Visit (HOSPITAL_COMMUNITY): Payer: Self-pay

## 2014-10-24 LAB — GC/CHLAMYDIA PROBE AMP
CT PROBE, AMP APTIMA: NEGATIVE
GC Probe RNA: NEGATIVE

## 2014-10-25 LAB — CULTURE, BETA STREP (GROUP B ONLY)

## 2014-10-28 ENCOUNTER — Other Ambulatory Visit: Payer: Self-pay

## 2014-10-30 ENCOUNTER — Other Ambulatory Visit: Payer: Self-pay

## 2014-10-30 ENCOUNTER — Encounter: Payer: Self-pay | Admitting: Obstetrics and Gynecology

## 2014-11-06 ENCOUNTER — Other Ambulatory Visit: Payer: Self-pay

## 2014-11-06 ENCOUNTER — Ambulatory Visit (HOSPITAL_COMMUNITY): Payer: Self-pay

## 2014-11-08 ENCOUNTER — Encounter (HOSPITAL_COMMUNITY): Payer: Self-pay | Admitting: *Deleted

## 2014-11-08 ENCOUNTER — Inpatient Hospital Stay (HOSPITAL_COMMUNITY)
Admission: AD | Admit: 2014-11-08 | Discharge: 2014-11-10 | DRG: 774 | Disposition: A | Discharge status: Home / Self Care | Payer: Medicaid Other | Source: Ambulatory Visit | Attending: Obstetrics & Gynecology | Admitting: Obstetrics & Gynecology

## 2014-11-08 DIAGNOSIS — Z79899 Other long term (current) drug therapy: Secondary | ICD-10-CM

## 2014-11-08 DIAGNOSIS — O0933 Supervision of pregnancy with insufficient antenatal care, third trimester: Secondary | ICD-10-CM

## 2014-11-08 DIAGNOSIS — E119 Type 2 diabetes mellitus without complications: Secondary | ICD-10-CM | POA: Diagnosis present

## 2014-11-08 DIAGNOSIS — O24419 Gestational diabetes mellitus in pregnancy, unspecified control: Secondary | ICD-10-CM

## 2014-11-08 DIAGNOSIS — Z3A38 38 weeks gestation of pregnancy: Secondary | ICD-10-CM | POA: Diagnosis present

## 2014-11-08 DIAGNOSIS — O24429 Gestational diabetes mellitus in childbirth, unspecified control: Secondary | ICD-10-CM

## 2014-11-08 DIAGNOSIS — O9989 Other specified diseases and conditions complicating pregnancy, childbirth and the puerperium: Secondary | ICD-10-CM | POA: Diagnosis present

## 2014-11-08 DIAGNOSIS — O2432 Unspecified pre-existing diabetes mellitus in childbirth: Secondary | ICD-10-CM | POA: Diagnosis present

## 2014-11-08 LAB — CBC
HEMATOCRIT: 36.5 % (ref 36.0–46.0)
HEMOGLOBIN: 11.7 g/dL — AB (ref 12.0–15.0)
MCH: 24.8 pg — ABNORMAL LOW (ref 26.0–34.0)
MCHC: 32.1 g/dL (ref 30.0–36.0)
MCV: 77.5 fL — ABNORMAL LOW (ref 78.0–100.0)
Platelets: 251 10*3/uL (ref 150–400)
RBC: 4.71 MIL/uL (ref 3.87–5.11)
RDW: 14.8 % (ref 11.5–15.5)
WBC: 12.4 10*3/uL — AB (ref 4.0–10.5)

## 2014-11-08 LAB — TYPE AND SCREEN
ABO/RH(D): A POS
Antibody Screen: NEGATIVE

## 2014-11-08 LAB — GLUCOSE, CAPILLARY
GLUCOSE-CAPILLARY: 76 mg/dL (ref 65–99)
Glucose-Capillary: 82 mg/dL (ref 65–99)

## 2014-11-08 LAB — ABO/RH: ABO/RH(D): A POS

## 2014-11-08 MED ORDER — IBUPROFEN 600 MG PO TABS
600.0000 mg | ORAL_TABLET | Freq: Four times a day (QID) | ORAL | Status: DC
  Administered 2014-11-08 – 2014-11-10 (×7): 600 mg via ORAL
  Filled 2014-11-08 (×7): qty 1

## 2014-11-08 MED ORDER — ZOLPIDEM TARTRATE 5 MG PO TABS: 5.0000 mg | ORAL_TABLET | Freq: Every evening | ORAL | Status: DC | PRN

## 2014-11-08 MED ORDER — BENZOCAINE-MENTHOL 20-0.5 % EX AERO: 1.0000 "application " | INHALATION_SPRAY | CUTANEOUS | Status: DC | PRN

## 2014-11-08 MED ORDER — CITRIC ACID-SODIUM CITRATE 334-500 MG/5ML PO SOLN: 30.0000 mL | ORAL | Status: DC | PRN

## 2014-11-08 MED ORDER — LANOLIN HYDROUS EX OINT: TOPICAL_OINTMENT | CUTANEOUS | Status: DC | PRN

## 2014-11-08 MED ORDER — OXYCODONE-ACETAMINOPHEN 5-325 MG PO TABS: 1.0000 | ORAL_TABLET | ORAL | Status: DC | PRN

## 2014-11-08 MED ORDER — SENNOSIDES-DOCUSATE SODIUM 8.6-50 MG PO TABS
2.0000 | ORAL_TABLET | ORAL | Status: DC
  Administered 2014-11-08 – 2014-11-09 (×2): 2 via ORAL
  Filled 2014-11-08 (×2): qty 2

## 2014-11-08 MED ORDER — OXYTOCIN 40 UNITS IN LACTATED RINGERS INFUSION - SIMPLE MED
62.5000 mL/h | INTRAVENOUS | Status: DC
  Administered 2014-11-08: 62.5 mL/h via INTRAVENOUS
  Filled 2014-11-08: qty 1000

## 2014-11-08 MED ORDER — ONDANSETRON HCL 4 MG/2ML IJ SOLN: 4.0000 mg | INTRAMUSCULAR | Status: DC | PRN

## 2014-11-08 MED ORDER — OXYCODONE-ACETAMINOPHEN 5-325 MG PO TABS: 2.0000 | ORAL_TABLET | ORAL | Status: DC | PRN

## 2014-11-08 MED ORDER — ONDANSETRON HCL 4 MG PO TABS: 4.0000 mg | ORAL_TABLET | ORAL | Status: DC | PRN

## 2014-11-08 MED ORDER — SIMETHICONE 80 MG PO CHEW: 80.0000 mg | CHEWABLE_TABLET | ORAL | Status: DC | PRN

## 2014-11-08 MED ORDER — DIPHENHYDRAMINE HCL 25 MG PO CAPS: 25.0000 mg | ORAL_CAPSULE | Freq: Four times a day (QID) | ORAL | Status: DC | PRN

## 2014-11-08 MED ORDER — OXYTOCIN BOLUS FROM INFUSION
500.0000 mL | INTRAVENOUS | Status: DC
  Administered 2014-11-08: 500 mL via INTRAVENOUS

## 2014-11-08 MED ORDER — LACTATED RINGERS IV SOLN: INTRAVENOUS | Status: DC

## 2014-11-08 MED ORDER — WITCH HAZEL-GLYCERIN EX PADS: 1.0000 "application " | MEDICATED_PAD | CUTANEOUS | Status: DC | PRN

## 2014-11-08 MED ORDER — ACETAMINOPHEN 325 MG PO TABS: 650.0000 mg | ORAL_TABLET | ORAL | Status: DC | PRN

## 2014-11-08 MED ORDER — DIBUCAINE 1 % RE OINT: 1.0000 "application " | TOPICAL_OINTMENT | RECTAL | Status: DC | PRN

## 2014-11-08 MED ORDER — TETANUS-DIPHTH-ACELL PERTUSSIS 5-2.5-18.5 LF-MCG/0.5 IM SUSP: 0.5000 mL | Freq: Once | INTRAMUSCULAR | Status: DC

## 2014-11-08 MED ORDER — LIDOCAINE HCL (PF) 1 % IJ SOLN
30.0000 mL | INTRAMUSCULAR | Status: DC | PRN
  Filled 2014-11-08: qty 30

## 2014-11-08 MED ORDER — FENTANYL CITRATE (PF) 100 MCG/2ML IJ SOLN
100.0000 ug | INTRAMUSCULAR | Status: DC | PRN
  Administered 2014-11-08: 100 ug via INTRAVENOUS
  Filled 2014-11-08: qty 2

## 2014-11-08 MED ORDER — PRENATAL MULTIVITAMIN CH
1.0000 | ORAL_TABLET | Freq: Every day | ORAL | Status: DC
  Administered 2014-11-09 – 2014-11-10 (×2): 1 via ORAL
  Filled 2014-11-08 (×2): qty 1

## 2014-11-08 MED ORDER — FLEET ENEMA 7-19 GM/118ML RE ENEM: 1.0000 | ENEMA | RECTAL | Status: DC | PRN

## 2014-11-08 MED ORDER — LACTATED RINGERS IV SOLN: 500.0000 mL | INTRAVENOUS | Status: DC | PRN

## 2014-11-08 MED ORDER — ONDANSETRON HCL 4 MG/2ML IJ SOLN: 4.0000 mg | Freq: Four times a day (QID) | INTRAMUSCULAR | Status: DC | PRN

## 2014-11-08 NOTE — MAU Note (Signed)
Contractions started around 1700 and got worse around 2100.  Denies LOF/VB. Some bleeding around 0200 that was mucousy.

## 2014-11-08 NOTE — Progress Notes (Signed)
Assisted RN with interpretation of shift change with oncoming RN. Spanish Interpreter

## 2014-11-08 NOTE — Progress Notes (Signed)
Checked on patients needs.  °Spanish Interpreter  °

## 2014-11-08 NOTE — Progress Notes (Signed)
Checked on patients needs. Was present during delivery Spanish Interpreter

## 2014-11-08 NOTE — Lactation Note (Signed)
This note was copied from the chart of Bridget Gerre ScullOneyda Lowenthal. Lactation Consultation Note  Patient Name: Bridget Baird UJWJX'BToday's Date: 11/08/2014 Reason for consult: Initial assessment;Infant < 6lbs  Baby 5 hours old, mom GDM on Glyburide. In-house Spanish interpreter "Octavio GravesBonita" assisted. Although baby at breast and has dried colostrum around mouth, mom doesn't feel baby getting a lot at breast and wants to pump as well. Assisted mom to hand express and give baby drops of colostrum with spoon. Enc mom to put baby to breast first with cues and at least every 3 hours. Enc mom to supplement with her colostrum, using a spoon through the night. Then enc mom to pump after baby fed and keep EBM, covered, at bedside for next feeding. Discussed that because baby small, baby will probably tire at the breast, so will need to give EBM to baby and pump for a good supply. Mom states that she understands feeding plan. Set mom up with DEBP and got her started pumping. [facility] and [facility]. Mom given Spanish [facility] brochure, aware of OP/BFSG and LC phone line assistance with interpreter after D/C. Discussed assessment, interventions, and plan with patient's RN Jon GillsAlexis. Maternal Data Has patient been taught Hand Expression?: Yes Does the patient have breastfeeding experience prior to this delivery?: No  Feeding Feeding Type: Breast Milk Length of feed: 3 min  LATCH Score/Interventions Latch: Repeated attempts needed to sustain latch, nipple held in mouth throughout feeding, stimulation needed to elicit sucking reflex. Intervention(s): Adjust position;Assist with latch;Breast massage;Breast compression  Audible Swallowing: A few with stimulation Intervention(s): Skin to skin;Hand expression  Type of Nipple: Everted at rest and after stimulation (needed to sandwich)  Comfort (Breast/Nipple): Soft / non-tender     Hold (Positioning): Full assist,  staff holds infant at breast Intervention(s): Breastfeeding basics reviewed;Support Pillows;Position options;Skin to skin  LATCH Score: 6  Lactation Tools Discussed/Used WIC Program: Yes Pump Review: Setup, frequency, and cleaning;Milk Storage Initiated by:: JW Date initiated:: 11/08/14   Consult Status Consult Status: Follow-up Date: 11/08/14 Follow-up type: In-patient    Geralynn OchsWILLIARD, Lydia Meng 11/08/2014, 11:15 PM

## 2014-11-08 NOTE — Progress Notes (Signed)
Assisted Victorino DikeJennifer with Lactation with interpretation of breastfeeding instructions. Spanish Interpreter

## 2014-11-08 NOTE — MAU Note (Signed)
Pt states here for contractions q5 minutes apart. Denies bleeding or lof.

## 2014-11-08 NOTE — H&P (Signed)
LABOR ADMISSION HISTORY AND PHYSICAL  Bridget Baird is a 24 y.o. female G2P1001 with IUP at 7874w1d by LMP presenting for SOL and SROM x10 hours.  She reports +FMs, No LOF, no VB, no blurry vision, headaches or peripheral edema, and RUQ pain.  She plans on breast  feeding. She request nothing for birth control.  Dating: By LMP --->  Estimated Date of Delivery: 11/21/14  Sono:    @[redacted]w[redacted]d , CWD, normal anatomy, cephalic presentation,  1287g, 61% EFW   Prenatal History/Complications:  Past Medical History: Past Medical History  Diagnosis Date  . Gestational diabetes     Past Surgical History: History reviewed. No pertinent past surgical history.  Obstetrical History: OB History    Gravida Para Term Preterm AB TAB SAB Ectopic Multiple Living   2 1 1       1       Social History: History   Social History  . Marital Status: Married    Spouse Name: N/A  . Number of Children: N/A  . Years of Education: N/A   Social History Main Topics  . Smoking status: Never Smoker   . Smokeless tobacco: Never Used  . Alcohol Use: No  . Drug Use: No  . Sexual Activity: Yes   Other Topics Concern  . None   Social History Narrative    Family History: Family History  Problem Relation Age of Onset  . Breast cancer Paternal Aunt   . Asthma Brother   . Asthma Daughter     Allergies: No Known Allergies  Prescriptions prior to admission  Medication Sig Dispense Refill Last Dose  . aspirin 81 MG tablet Take 1 tablet (81 mg total) by mouth daily. (Patient not taking: Reported on 11/08/2014) 30 tablet 11 Taking  . glyBURIDE (DIABETA) 5 MG tablet Take 0.5 tablets (2.5 mg total) by mouth at bedtime. (Patient not taking: Reported on 11/08/2014) 30 tablet 1      Review of Systems   All systems reviewed and negative except as stated in HPI  Blood pressure 120/65, resp. rate 18, height 5\' 4"  (1.626 m), weight 212 lb 4 oz (96.276 kg), last menstrual period 02/14/2014. General appearance:  alert and cooperative Lungs: clear to auscultation bilaterally Heart: regular rate and rhythm Abdomen: soft, non-tender; bowel sounds normal Extremities: Homans sign is negative, no sign of DVT DTR's present  Presentation: cephalic Fetal monitoringBaseline: 135 bpm, Variability: Good {> 6 bpm), Accelerations: Reactive and Decelerations: Absent Uterine activity irregular ctx every 2-4 minutes   Dilation: 5 Effacement (%): 80 Station: -2, -1 Exam by:: Exie ParodyA. Gagliardo, RN   Prenatal labs: ABO, Rh: A/Positive/-- (01/21 0000) Antibody: Negative (01/21 0000) Rubella:   RPR: NON REAC (05/02 1158)  HBsAg: Negative (01/21 0000)  HIV: NONREACTIVE (05/02 1158)  GBS:   negative 1 hr Glucola 153 3hr: 87/140/156/144 Genetic screening  declined Anatomy US normal   Clinic HRC: transfer from HD at 15w Prenatal Labs  Dating LMP consistent with 6wk u/s Blood type: A/Positive/-- (01/21 0000)   Genetic Screen 1 Screen: declines Antibody:Negative (01/21 0000)  Anatomic US normal 18w> repeat @ 28w Rubella: Immune (01/21 0000)  GTT Early: 153 --> 87/140/156/144 --> GDM RPR: Nonreactive (01/21 0000)   Flu vaccine  HBsAg: Negative (01/21 0000)   TDaP vaccine   HIV: Non-reactive (01/21 0000)   GBS  negative  GBS:   Contraception condoms Pap:  Baby Food breast   Circumcision    Pediatrician    Support Person  Prenatal Transfer Tool  Maternal Diabetes: Yes:  Diabetes Type:  Pre-pregnancy Genetic Screening: Declined Maternal Ultrasounds/Referrals: Normal Fetal Ultrasounds or other Referrals:  None Maternal Substance Abuse:  No Significant Maternal Medications:  None Significant Maternal Lab Results: Lab values include: Group B Strep negative  No results found for this or any previous visit (from the past 24 hour(s)).  Patient Active Problem List   Diagnosis Date Noted  . Insufficient  prenatal care in third trimester 10/23/2014  . Supervision of high risk pregnancy in second trimester 06/16/2014  . PCOS (polycystic ovarian syndrome) 01/21/2014  . Gestational diabetes mellitus, antepartum 01/01/2014  . Hirsutism 01/01/2014  . Weight gain 01/01/2014  . Headache(784.0) 01/01/2014    Assessment: Bridget Baird is a 24 y.o. G2P1001 at [redacted]w[redacted]d here for SOL.   #Labor: augment with pitocin #Pain: Does not desire an epidural or IV pain medicine #FWB: Cat I #ID:  GBS negative #MOF: breast  #MOC:nothing  De Hollingshead 11/08/2014, 12:22 PM   I spoke with and examined patient and agree with resident/PA/SNM's note and plan of care. SROM clear fluid 0200, +uc's, good fm, no vb. A2/B DM on glyburide, will get q 2hr cbg's. No pnc from 27-35wks, then none after 35wks. No recent EFW. Has made cervical change to 6-7, will not need pitocin augmentation. Anticipate NSVB.  Cheral Marker, CNM, [facility] 11/08/2014 6:54 PM

## 2014-11-08 NOTE — MAU Note (Signed)
HMitchell, RN charge notified of pt to be admitted. Will call back with bed assignment.

## 2014-11-09 LAB — GLUCOSE, CAPILLARY: Glucose-Capillary: 100 mg/dL — ABNORMAL HIGH (ref 65–99)

## 2014-11-09 LAB — RPR: RPR Ser Ql: NONREACTIVE

## 2014-11-09 NOTE — Progress Notes (Signed)
Checked on patient needs  °Spanish Interpreter °

## 2014-11-09 NOTE — Progress Notes (Signed)
Post Partum Day 1 Subjective: no complaints, up ad lib, voiding, tolerating PO and + flatus  Objective: Blood pressure 95/55, pulse 62, temperature 97.5 F (36.4 C), temperature source Oral, resp. rate 20, height 5\' 4"  (1.626 m), weight 212 lb (96.163 kg), last menstrual period 02/14/2014, SpO2 99 %, unknown if currently breastfeeding.  Physical Exam:  General: alert, cooperative, appears stated age and no distress Lochia: appropriate Uterine Fundus: firm Incision: n/a DVT Evaluation: No evidence of DVT seen on physical exam. Negative Homan's sign. No cords or calf tenderness.   Recent Labs  11/08/14 1250  HGB 11.7*  HCT 36.5    Assessment/Plan: Plan for discharge tomorrow   LOS: 1 day   LAWSON, MARIE DARLENE 11/09/2014, 9:58 AM

## 2014-11-09 NOTE — Lactation Note (Signed)
This note was copied from the chart of Bridget Gerre ScullOneyda Sato. Lactation Consultation Note  Baby struggling to latch.  Mom has very small nipple.  Good breast compression done but baby still unable to latch. Reviewed hand expression with mom and small amount of colostrum seen.  20 mm nipple shield applied and baby latched easily.  Baby becomes sleepy at breast and good waking techniques needed.  Mom is also using breast massage to increase flow of milk.  Instructed to continue to post pump every 3 hours to stimulate milk supply.  Encouraged to call with concerns prn/  Patient Name: Bridget Baird ZOXWR'UToday's Date: 11/09/2014 Reason for consult: Follow-up assessment;Difficult latch   Maternal Data    Feeding Feeding Type: Breast Fed  LATCH Score/Interventions Latch: Repeated attempts needed to sustain latch, nipple held in mouth throughout feeding, stimulation needed to elicit sucking reflex. (20 mm nipple shield ) Intervention(s): Adjust position;Assist with latch;Breast massage;Breast compression  Audible Swallowing: A few with stimulation Intervention(s): Skin to skin;Hand expression;Alternate breast massage  Type of Nipple: Flat Intervention(s): Double electric pump  Comfort (Breast/Nipple): Soft / non-tender     Hold (Positioning): Assistance needed to correctly position infant at breast and maintain latch. Intervention(s): Breastfeeding basics reviewed;Support Pillows;Skin to skin  LATCH Score: 6  Lactation Tools Discussed/Used Tools: Nipple Shields Nipple shield size: 20   Consult Status Consult Status: Follow-up Date: 11/10/14 Follow-up type: In-patient    Huston FoleyMOULDEN, Bridget Erisman S 11/09/2014, 9:51 AM

## 2014-11-09 NOTE — Progress Notes (Signed)
Checked on patient needs.  Ordered patient dinner and late snack. Spanish Interpreter

## 2014-11-09 NOTE — Progress Notes (Signed)
Post Partum Day 1 Subjective:  Bridget Baird is a 24 y.o. G2P2001 8546w1d with SVD 1 day ago.  No acute events overnight.  Pt denies problems with ambulating, voiding or po intake.  She denies nausea or vomiting.  Pain is well controlled.  She has had flatus. She has not had bowel movement.  Lochia Minimal.  Plan for birth control is no method.  Method of Feeding: Breast and bottle feeding.  Objective: Blood pressure 96/51, pulse 68, temperature 98.5 F (36.9 C), temperature source Oral, resp. rate 18, height 5\' 4"  (1.626 m), weight 96.163 kg (212 lb), last menstrual period 02/14/2014, SpO2 99 %, unknown if currently breastfeeding.  Physical Exam:  General: alert, cooperative and no distress Lochia:normal flow Chest: CTAB Heart: RRR no m/r/g Abdomen: +BS, soft, nontender,  Uterine Fundus: firm DVT Evaluation: No evidence of DVT seen on physical exam. Extremities: no edema noted on physical exam   Recent Labs  11/08/14 1250  HGB 11.7*  HCT 36.5    Assessment/Plan:  ASSESSMENT: Bridget Baird is a 24 y.o. G2P2001 2146w1d with a history of gestational diabetes A2/BDM and received poor prenatal care. Last capillary glucose was 82 and was taken about a day ago. She has been taking glyburide for her diabetes, but has not been checking sugars.  Discharge home possible for today will inquire further plans or labs that may need to be done prior to discharge.   LOS: 1 day   Kandyce RudStephen Frempong Jr. 11/09/2014, 9:17 AM

## 2014-11-09 NOTE — Progress Notes (Signed)
Checked on patient needs. Ordered patient breakfast.  Spanish Interpreter

## 2014-11-09 NOTE — Clinical Social Work Maternal (Signed)
  CLINICAL SOCIAL WORK MATERNAL/CHILD NOTE  Patient Details  Name: Bridget Baird MRN: [id number] Date of Birth: 01/09/1991  Date:  11/09/2014  Clinical Social Worker Initiating Note:  Norlene Duel, LCSW Date/ Time Initiated:  11/09/14/0930     Child's Name:  Rosiland Oz   Legal Guardian:   (Parents Elgie Congo and Gloriann Loan)   Need for Interpreter:  None   Date of Referral:   (11/08/14)     Reason for Referral:  Other (Comment)   Referral Source:  [facility]   Address:  [place], [place] [address]  Phone number:   [phone])   Household Members:  Spouse, Minor Children   Natural Supports (not living in the home):  Immediate Family, Extended Family   Professional Supports: None   Employment:  (Spouse is employed)   Type of Work:  (mother plans to apply for [employer])   Education:      Museum/gallery curator Resources:      Other Resources:  [facility]   Cultural/Religious Considerations Which May Impact Care:  none noted Strengths:  Ability to meet basic needs , Home prepared for child    Risk Factors/Current Problems:  None   Cognitive State:  Alert , Able to Concentrate    Mood/Affect:  Happy , Calm , Relaxed    CSW Assessment:  Acknowledged order for social work consult regarding mother receiving limited PNC.  Met with MOB.   She was pleasant and receptive to social work intervention.  Parent are married and have one other dependent age 31.    Mother states that she had limited [place] due to being uninsured.  She denies hx of mental illness or substance abuse.   She reports being well prepared for newborn.  No acute social concerns noted or reported at this time.  Mother informed of social work Fish farm manager.  CSW Plan/Description:     No further intervention required No barriers to discharge Mother given contact information for financial counselor Hunt Oris, LCSW 11/09/2014, 3:40 PM

## 2014-11-10 MED ORDER — IBUPROFEN 600 MG PO TABS: 600.0000 mg | ORAL_TABLET | Freq: Four times a day (QID) | ORAL | Status: DC

## 2014-11-10 NOTE — Discharge Instructions (Signed)

## 2014-11-10 NOTE — Progress Notes (Signed)
[facility]

## 2014-11-10 NOTE — Discharge Summary (Signed)
Obstetric Discharge Summary Reason for Admission: onset of labor Prenatal Procedures: ultrasound Intrapartum Procedures: spontaneous vaginal delivery Postpartum Procedures: none Complications-Operative and Postpartum: none HEMOGLOBIN  Date Value Ref Range Status  11/08/2014 11.7* 12.0 - 15.0 g/dL Final  16/10/960401/21/2016 54.012.5 g/dL Final   HCT  Date Value Ref Range Status  11/08/2014 36.5 36.0 - 46.0 % Final  05/15/2014 39 % Final    Physical Exam:  General: alert, cooperative, appears stated age and no distress Lochia: appropriate Uterine Fundus: firm Incision: n/a DVT Evaluation: No evidence of DVT seen on physical exam. Negative Homan's sign. No cords or calf tenderness. No significant calf/ankle edema.  Discharge Diagnoses: Term Pregnancy-delivered  Discharge Information: Date: 11/10/2014 Activity: pelvic rest Diet: routine Medications: None, Ibuprofen and Percocet Condition: stable and improved Instructions: refer to practice specific booklet Discharge to: home   Newborn Data: Live born female  Birth Weight: 5 lb 8 oz (2495 g) APGAR: 9, 9  Home with mother.  LAWSON, MARIE DARLENE 11/10/2014, 4:16 AM

## 2014-11-10 NOTE — Lactation Note (Signed)
This note was copied from the chart of Bridget Baird. Lactation Consultation Note: Mother bottle fed infant 15 ml of formula 2 1/2 hours ago. Attempt to breast feed and infant very sleepy. He latched on for only a few sucks. Assist mother with pumping. Mother pumped 6 ml . Attempt to latch infant again without success. Mother was fit with a # 20 mm nipple shield. Infant latched well with good depth for 25 mins. Observed frequent suckling and swallows.  Infant was given 6 ml of ebm with a curved tip syringe thought the nipple shield. Advised to give infant at least 20 ml or more of formula. [facility] . [facility] also [facility].   Patient Name: Bridget Baird JYNWG'NToday's Date: 11/10/2014 Reason for consult: Follow-up assessment   Maternal Data    Feeding Feeding Type: Formula Length of feed: 25 min  LATCH Score/Interventions Latch: Repeated attempts needed to sustain latch, nipple held in mouth throughout feeding, stimulation needed to elicit sucking reflex. Intervention(s): Adjust position;Breast massage;Breast compression  Audible Swallowing: Spontaneous and intermittent Intervention(s): Skin to skin;Hand expression  Type of Nipple: Flat Intervention(s): Hand pump;Double electric pump  Comfort (Breast/Nipple): Filling, red/small blisters or bruises, mild/mod discomfort  Problem noted: Filling Interventions (Filling): Double electric pump  Hold (Positioning): Assistance needed to correctly position infant at breast and maintain latch. Intervention(s): Support Pillows;Position options  LATCH Score: 6  Lactation Tools Discussed/Used Tools: Nipple Shields Nipple shield size: 20   Consult Status Consult Status: Follow-up Date: 11/10/14 Follow-up type: In-patient    Stevan BornKendrick, Whitley Patchen [facility] 11/10/2014, 11:54 AM

## 2014-11-10 NOTE — Progress Notes (Signed)
UR chart review completed.  

## 2014-12-12 ENCOUNTER — Encounter: Payer: Self-pay | Admitting: Obstetrics and Gynecology

## 2014-12-12 ENCOUNTER — Ambulatory Visit (INDEPENDENT_AMBULATORY_CARE_PROVIDER_SITE_OTHER): Payer: Self-pay | Admitting: Obstetrics and Gynecology

## 2014-12-12 NOTE — Progress Notes (Signed)
  Subjective:     Bridget Baird is a 24 y.o. female who presents for a postpartum visit. She is 4 weeks postpartum following a spontaneous vaginal delivery. I have fully reviewed the prenatal and intrapartum course. The delivery was at 38.1 gestational weeks. Outcome: spontaneous vaginal delivery. Anesthesia: none. Postpartum course has been uncomplicated. Baby's course has been uncomplicated. Baby is feeding by breast x 3 weeks and now bottle - Similac Advance. Bleeding no bleeding. Bowel function is normal. Bladder function is normal. Patient is sexually active. Contraception method is none. Patient desires to conceive very soon. Postpartum depression screening: negative.     Review of Systems Pertinent items are noted in HPI.   Objective:    BP 121/65 mmHg  Pulse 68  Temp(Src) 98.1 F (36.7 C) (Oral)  Ht  (1.575 m)  Wt 202 lb 12.8 oz (91.989 kg)  BMI 37.08 kg/m2  Breastfeeding? No  General:  alert, cooperative and no distress   Breasts:  inspection negative, no nipple discharge or bleeding, no masses or nodularity palpable  Lungs: clear to auscultation bilaterally  Heart:  regular rate and rhythm  Abdomen: soft, non-tender; bowel sounds normal; no masses,  no organomegaly   Vulva:  normal  Vagina: normal vagina, no discharge, exudate, lesion, or erythema  Cervix:  multiparous appearance  Corpus: normal size, contour, position, consistency, mobility, non-tender  Adnexa:  no mass, fullness, tenderness  Rectal Exam: Not performed.        Assessment:     Normal postpartum exam. Pap smear not done at today's visit.   Plan:    1. Contraception: none. Patient desires to conceive very soon. Discussed waiting at least following 2 hr glucola as pregestational diabetes is differently managed than GDM. Also encouraged to continue diabetic diet and weight loss regimen Continue taking prenatal vitamins Informed patient that ideal interpregnancy interval is 18 months.  2.  Patient is medically cleared to resume all activities of daily living.  3. Follow up in: 2 weeks for 2 hr glucola. Patient should return in January for annual exam or as needed.

## 2014-12-26 ENCOUNTER — Other Ambulatory Visit: Payer: Self-pay

## 2015-02-09 ENCOUNTER — Ambulatory Visit (INDEPENDENT_AMBULATORY_CARE_PROVIDER_SITE_OTHER): Payer: Self-pay | Admitting: Gynecology

## 2015-02-09 ENCOUNTER — Encounter: Payer: Self-pay | Admitting: Gynecology

## 2015-02-09 VITALS — BP 124/78 | Ht 64.5 in | Wt 215.0 lb

## 2015-02-09 DIAGNOSIS — Z8632 Personal history of gestational diabetes: Secondary | ICD-10-CM

## 2015-02-09 DIAGNOSIS — E663 Overweight: Secondary | ICD-10-CM

## 2015-02-09 DIAGNOSIS — R519 Headache, unspecified: Secondary | ICD-10-CM

## 2015-02-09 DIAGNOSIS — N912 Amenorrhea, unspecified: Secondary | ICD-10-CM

## 2015-02-09 DIAGNOSIS — R51 Headache: Secondary | ICD-10-CM

## 2015-02-09 DIAGNOSIS — N970 Female infertility associated with anovulation: Secondary | ICD-10-CM

## 2015-02-09 LAB — HEMOGLOBIN A1C
HEMOGLOBIN A1C: 5.4 % (ref <5.7)
Mean Plasma Glucose: 108 mg/dL (ref <117)

## 2015-02-09 LAB — TSH: TSH: 3.564 u[IU]/mL (ref 0.350–4.500)

## 2015-02-09 LAB — PREGNANCY, URINE: Preg Test, Ur: NEGATIVE

## 2015-02-09 MED ORDER — MEDROXYPROGESTERONE ACETATE 10 MG PO TABS: ORAL_TABLET | ORAL | Status: DC

## 2015-02-09 NOTE — Progress Notes (Signed)
   patient is a 24 year old gravida 2 para 2 with long-standing history of oligomenorrhea attributed to her PCOS type picture. Patient conceived on clomiphene citrate 100 mg from day 5 through 9 last year. She delivered in July of this year vaginally and had developed gestational diabetes. Prior to that she had been on metformin but not on any medication now. She stated that she went to her postpartum visit and everything checked out fine. She is here today because she has not had a menstrual cycle since before the birth of her child. She denies any nipple discharge any unusual visual disturbances but occasional headaches in the occipital region. Patient wants to get pregnant once again.  In 2015 the following blood work have been noted: Her nonfasting blood sugar was found to be at 120 over disease hemoglobin A1c 5.8) DHEA and 17 hydroxyprogesterone and total testosterone wall in the normal range. TSH and prolactin were also normal  Exam: Blood pressure 124/78 weight 215 pounds   Height 5 feet 4-1/2 inches tall   BMI 36.33 kg/m Gen. appearance well-developed well-nourished female in no acute distress overweight Abdomen: Soft nontender no rebound or guarding Pelvic: Bartholin urethra Skene was within normal limits Vagina: No lesions or discharge Cervix no lesions or discharge Uterus: Anteverted normal size shape and consistency Adnexa: No palpable masses or tenderness Rectal exam not done  Assessment/plan: #1 secondary amenorrhea probably attributed to overweight, PCOS history affecting her ovulation. Urine trace test was negative today. She will be prescribed Provera 10 mg to take 1 by mouth daily for 10 days to withdrawal. I've given her refills and she will do this every 30 days if she does not have a spontaneous menses. She also needs to make sure that she is not pregnant by doing a home urine pregnancy test. #2 pregnancy interval I explained to her as to shorten the time for months from her  last delivery recommendations at least one year. She is not breast-feeding. I've recommended she use barrier contraception during this time and return to the office next year for an annual exam and at which time we will prescribe ovulation induction medication. #3 as a result of her secondary menorrhea a TSH, prolactin will be drawn today. Also since her history of gestational diabetes and PCOS we are going to check a hemoglobin A1c as well. All the above instructions were provided in Spanish.

## 2015-02-10 LAB — PROLACTIN: PROLACTIN: 7.1 ng/mL

## 2015-03-27 ENCOUNTER — Encounter (HOSPITAL_COMMUNITY): Payer: Self-pay | Admitting: *Deleted

## 2015-03-27 ENCOUNTER — Inpatient Hospital Stay (HOSPITAL_COMMUNITY): Payer: Self-pay

## 2015-03-27 ENCOUNTER — Inpatient Hospital Stay (HOSPITAL_COMMUNITY)
Admission: AD | Admit: 2015-03-27 | Discharge: 2015-03-27 | Disposition: A | Discharge status: Home / Self Care | Payer: Self-pay | Source: Ambulatory Visit | Attending: Obstetrics & Gynecology | Admitting: Obstetrics & Gynecology

## 2015-03-27 DIAGNOSIS — O200 Threatened abortion: Secondary | ICD-10-CM | POA: Insufficient documentation

## 2015-03-27 DIAGNOSIS — O4692 Antepartum hemorrhage, unspecified, second trimester: Secondary | ICD-10-CM

## 2015-03-27 DIAGNOSIS — Z3A15 15 weeks gestation of pregnancy: Secondary | ICD-10-CM | POA: Insufficient documentation

## 2015-03-27 DIAGNOSIS — O468X1 Other antepartum hemorrhage, first trimester: Secondary | ICD-10-CM

## 2015-03-27 DIAGNOSIS — O418X10 Other specified disorders of amniotic fluid and membranes, first trimester, not applicable or unspecified: Secondary | ICD-10-CM

## 2015-03-27 LAB — WET PREP, GENITAL
CLUE CELLS WET PREP: NONE SEEN
SPERM: NONE SEEN
Trich, Wet Prep: NONE SEEN
Yeast Wet Prep HPF POC: NONE SEEN

## 2015-03-27 LAB — CBC
HCT: 38.8 % (ref 36.0–46.0)
HEMOGLOBIN: 12.4 g/dL (ref 12.0–15.0)
MCH: 25.3 pg — AB (ref 26.0–34.0)
MCHC: 32 g/dL (ref 30.0–36.0)
MCV: 79 fL (ref 78.0–100.0)
Platelets: 324 10*3/uL (ref 150–400)
RBC: 4.91 MIL/uL (ref 3.87–5.11)
RDW: 13.7 % (ref 11.5–15.5)
WBC: 10.2 10*3/uL (ref 4.0–10.5)

## 2015-03-27 LAB — URINE MICROSCOPIC-ADD ON: WBC, UA: NONE SEEN WBC/hpf (ref 0–5)

## 2015-03-27 LAB — URINALYSIS, ROUTINE W REFLEX MICROSCOPIC
Bilirubin Urine: NEGATIVE
Glucose, UA: NEGATIVE mg/dL
Ketones, ur: NEGATIVE mg/dL
Leukocytes, UA: NEGATIVE
Nitrite: NEGATIVE
Protein, ur: 30 mg/dL — AB
Specific Gravity, Urine: 1.015 (ref 1.005–1.030)
pH: 6.5 (ref 5.0–8.0)

## 2015-03-27 LAB — POCT PREGNANCY, URINE: Preg Test, Ur: POSITIVE — AB

## 2015-03-27 LAB — HCG, QUANTITATIVE, PREGNANCY: hCG, Beta Chain, Quant, S: 79263 m[IU]/mL — ABNORMAL HIGH (ref <5)

## 2015-03-27 NOTE — MAU Note (Addendum)
Had cramping throughout the night (last night); started moderate to heavy bleeding around 0900 this AM; passed some large clot and a "small purple looking ball"; ? gestation age- by LNMP pt would be 8320w3d but pt had a negative UPT on 02-09-15;

## 2015-03-27 NOTE — MAU Provider Note (Signed)
Chief Complaint: Vaginal Bleeding and Abdominal Cramping   None     SUBJECTIVE HPI: Bridget Baird is a 24 y.o. G3P2002 at 4459w3d by LMP who presents to maternity admissions reporting onset of abdominal cramping yesterday followed by heavy vaginal bleeding with large clots today. She reports one large grey clot at home making her concerned she is having a miscarriage.  She reports soaking 1 pad/hour at first, but bleeding is lighter now in MAU.   She vaginal itching/burning, urinary symptoms, h/a, dizziness, n/v, or fever/chills.     Abdominal Pain This is a new problem. The current episode started yesterday. The onset quality is gradual. The problem occurs intermittently. The problem has been waxing and waning. The pain is located in the LLQ and RLQ. The pain is moderate. The quality of the pain is cramping. The abdominal pain does not radiate. Pertinent negatives include no constipation, diarrhea, dysuria, fever, frequency, headaches, nausea or vomiting. Nothing aggravates the pain. The pain is relieved by nothing. She has tried nothing for the symptoms.  Vaginal Bleeding The patient's primary symptoms include pelvic pain and vaginal bleeding. The patient's pertinent negatives include no vaginal discharge. This is a new problem. The current episode started today. The problem occurs constantly. The problem has been gradually improving. The pain is moderate. She is pregnant. Associated symptoms include abdominal pain. Pertinent negatives include no chills, constipation, diarrhea, dysuria, fever, flank pain, frequency, headaches, nausea or vomiting. The vaginal bleeding is typical of menses. She has been passing clots. Nothing aggravates the symptoms. She has tried nothing for the symptoms. No, her partner does not have an STD.    Past Medical History  Diagnosis Date  . Gestational diabetes    Past Surgical History  Procedure Laterality Date  . No past surgeries     Social History   Social  History  . Marital Status: Married    Spouse Name: N/A  . Number of Children: N/A  . Years of Education: N/A   Occupational History  . Not on file.   Social History Main Topics  . Smoking status: Never Smoker   . Smokeless tobacco: Never Used  . Alcohol Use: No  . Drug Use: No  . Sexual Activity: Yes   Other Topics Concern  . Not on file   Social History Narrative   No current facility-administered medications on file prior to encounter.   No current outpatient prescriptions on file prior to encounter.   No Known Allergies  ROS:  Review of Systems  Constitutional: Negative for fever, chills and fatigue.  HENT: Negative for sinus pressure.   Eyes: Negative for photophobia.  Respiratory: Negative for shortness of breath.   Cardiovascular: Negative for chest pain.  Gastrointestinal: Positive for abdominal pain. Negative for nausea, vomiting, diarrhea and constipation.  Genitourinary: Positive for vaginal bleeding and pelvic pain. Negative for dysuria, frequency, flank pain, vaginal discharge, difficulty urinating and vaginal pain.  Musculoskeletal: Negative for neck pain.  Neurological: Negative for dizziness, weakness and headaches.  Psychiatric/Behavioral: Negative.      I have reviewed patient's Past Medical Hx, Surgical Hx, Family Hx, Social Hx, medications and allergies.   Physical Exam   Patient Vitals for the past 24 hrs:  BP Temp Temp src Pulse Resp Height Weight  03/27/15 1549 110/59 mmHg - - 73 16 - -  03/27/15 1144 114/67 mmHg - - 71 18 - -  03/27/15 1133 113/68 mmHg 98 F (36.7 C) Oral 72 18 5\' 4"  (1.626 m) 217 lb (  98.431 kg)   Constitutional: Well-developed, well-nourished female in no acute distress.  Cardiovascular: normal rate Respiratory: normal effort GI: Abd soft, non-tender. Pos BS x 4 MS: Extremities nontender, no edema, normal ROM Neurologic: Alert and oriented x 4.  GU: Neg CVAT.  PELVIC EXAM: Cervix pink, visually closed, without  lesion, small amount dark red bleeding, vaginal walls and external genitalia normal Bimanual exam: Cervix 0/long/high, firm, anterior, neg CMT, uterus nontender, ~10 week size, adnexa without tenderness, enlargement, or mass   LAB RESULTS Results for orders placed or performed during the hospital encounter of 03/27/15 (from the past 24 hour(s))  Urinalysis, Routine w reflex microscopic (not at [facility])     Status: Abnormal   Collection Time: 03/27/15 11:35 AM  Result Value Ref Range   Color, Urine RED (A) YELLOW   APPearance CLOUDY (A) CLEAR   Specific Gravity, Urine 1.015 1.005 - 1.030   pH 6.5 5.0 - 8.0   Glucose, UA NEGATIVE NEGATIVE mg/dL   Hgb urine dipstick LARGE (A) NEGATIVE   Bilirubin Urine NEGATIVE NEGATIVE   Ketones, ur NEGATIVE NEGATIVE mg/dL   Protein, ur 30 (A) NEGATIVE mg/dL   Nitrite NEGATIVE NEGATIVE   Leukocytes, UA NEGATIVE NEGATIVE  Urine microscopic-add on     Status: Abnormal   Collection Time: 03/27/15 11:35 AM  Result Value Ref Range   Squamous Epithelial / LPF 0-5 (A) NONE SEEN   WBC, UA NONE SEEN 0 - 5 WBC/hpf   RBC / HPF TOO NUMEROUS TO COUNT 0 - 5 RBC/hpf   Bacteria, UA RARE (A) NONE SEEN  Pregnancy, urine POC     Status: Abnormal   Collection Time: 03/27/15 11:42 AM  Result Value Ref Range   Preg Test, Ur POSITIVE (A) NEGATIVE  Wet prep, genital     Status: Abnormal   Collection Time: 03/27/15 12:40 PM  Result Value Ref Range   Yeast Wet Prep HPF POC NONE SEEN NONE SEEN   Trich, Wet Prep NONE SEEN NONE SEEN   Clue Cells Wet Prep HPF POC NONE SEEN NONE SEEN   WBC, Wet Prep HPF POC FEW (A) NONE SEEN   Sperm NONE SEEN   CBC     Status: Abnormal   Collection Time: 03/27/15 12:48 PM  Result Value Ref Range   WBC 10.2 4.0 - 10.5 K/uL   RBC 4.91 3.87 - 5.11 MIL/uL   Hemoglobin 12.4 12.0 - 15.0 g/dL   HCT 19.1 47.8 - 29.5 %   MCV 79.0 78.0 - 100.0 fL   MCH 25.3 (L) 26.0 - 34.0 pg   MCHC 32.0 30.0 - 36.0 g/dL   RDW 62.1 30.8 - 65.7 %   Platelets 324  150 - 400 K/uL  hCG, quantitative, pregnancy     Status: Abnormal   Collection Time: 03/27/15 12:48 PM  Result Value Ref Range   hCG, Beta Chain, Quant, S 84696 (H) <5 mIU/mL    --/--/A POS, A POS (07/16 1250)  IMAGING US Ob Comp Less 14 Wks  03/27/2015  CLINICAL DATA:  Bleeding today. Cramping last night. Quantitative beta HCG is 79,263. Gravida 3 para 2. By LMP of 12/09/2014 the patient is 15 weeks 3 days. EDC by LMP is 09/15/2015. EXAM: OBSTETRIC <14 WK [place] AND TRANSVAGINAL OB US TECHNIQUE: Both transabdominal and transvaginal ultrasound examinations were performed for complete evaluation of the gestation as well as the maternal uterus, adnexal regions, and pelvic cul-de-sac. Transvaginal technique was performed to assess early pregnancy. COMPARISON:  None applicable FINDINGS:  Intrauterine gestational sac: Present Yolk sac:  Present Embryo:  Present Cardiac Activity: Present Heart Rate: 161  bpm CRL:  27.8  mm   9 w   4 d                  [place] EDC: 10/26/2015 Maternal uterus/adnexae: Moderate to large subchorionic hemorrhage is identified. The ovaries have a normal appearance. Trace free pelvic fluid is identified. IMPRESSION: 1. Single living intrauterine embryo. 2. Ultrasound dating is different from clinical dating. 3. By today's exam EDC is 10/26/2011. 4. Moderate to large subchorionic hemorrhage. Electronically Signed   By: Norva Pavlov M.D.   On: 03/27/2015 15:41   US Ob Transvaginal  03/27/2015  CLINICAL DATA:  Bleeding today. Cramping last night. Quantitative beta HCG is 79,263. Gravida 3 para 2. By LMP of 12/09/2014 the patient is 15 weeks 3 days. EDC by LMP is 09/15/2015. EXAM: OBSTETRIC <14 WK [place] AND TRANSVAGINAL OB US TECHNIQUE: Both transabdominal and transvaginal ultrasound examinations were performed for complete evaluation of the gestation as well as the maternal uterus, adnexal regions, and pelvic cul-de-sac. Transvaginal technique was performed to assess early pregnancy.  COMPARISON:  None applicable FINDINGS: Intrauterine gestational sac: Present Yolk sac:  Present Embryo:  Present Cardiac Activity: Present Heart Rate: 161  bpm CRL:  27.8  mm   9 w   4 d                  [place] EDC: 10/26/2015 Maternal uterus/adnexae: Moderate to large subchorionic hemorrhage is identified. The ovaries have a normal appearance. Trace free pelvic fluid is identified. IMPRESSION: 1. Single living intrauterine embryo. 2. Ultrasound dating is different from clinical dating. 3. By today's exam EDC is 10/26/2011. 4. Moderate to large subchorionic hemorrhage. Electronically Signed   By: Norva Pavlov M.D.   On: 03/27/2015 15:41    MAU Management/MDM: Ordered labs and imaging and reviewed results.  IUP on today's ultrasound with moderate to large [facility].  Discussed findings with pt as common, with larger [facility] more associated with miscarriage.  Pt to follow up at health dept as scheduled and return to MAU if heavy bleeding or abdominal pain.  Pt stable at time of discharge.  ASSESSMENT 1. Threatened miscarriage in early pregnancy   2. Vaginal bleeding in pregnancy, second trimester   3. Subchorionic hemorrhage in first trimester     PLAN Discharge home with bleeding precautions   Follow-up Information    Follow up with [facility].   Why:  As scheduled   Contact information:   [address] [place] [address] [phone]       Follow up with [facility].   Why:  As needed for emergencies   Contact information:   [address] [id number] mc [place] [address] [phone]      Sharen Counter Certified Nurse-Midwife 03/27/2015  5:19 PM

## 2015-03-27 NOTE — MAU Note (Addendum)
Noted blood around 0900 when used restroom.  Has passed a large purplish clot.  Cramping in lower and low back.  +preg test at health dept at the end of Oct

## 2015-03-27 NOTE — Discharge Instructions (Signed)
Amenaza de aborto (Threatened Miscarriage) La amenaza de aborto se produce cuando hay hemorragia vaginal durante las primeras 20semanas de Hunt[place]embarazo, pero el embarazo no se interrumpe. Si durante este perodo usted tiene hemorragia vaginal, el mdico le har pruebas para asegurarse de que el embarazo contine. Si las pruebas muestran que usted contina embarazada y que el "beb" en desarrollo (feto) dentro del tero sigue creciendo, se considera que tuvo una Actonamenaza de aborto. La amenaza de aborto no implica que el embarazo vaya a Midwifeterminar, pero s aumenta el riesgo de perder el embarazo (aborto completo). CAUSAS  Por lo general, no se conoce la causa de la amenaza de aborto. Si el resultado final es el aborto completo, la causa ms frecuente es la cantidad anormal de cromosomas del feto. Los cromosomas son las estructuras internas de las clulas que contienen todo Animatorel material gentico. Environmental managerAlgunas de las causas de hemorragia vaginal que no ocasionan un aborto incluyen:  Las Clinical research associaterelaciones sexuales.  Las infecciones.  Los cambios hormonales normales durante el [place]azo.  La hemorragia que se produce cuando el vulo se implanta en el tero. FACTORES DE RIESGO Los factores de riesgo de hemorragia al principio del embarazo incluyen:  Obesidad.  Fumar.  El consumo de cantidades excesivas de alcohol o cafena.  El consumo de drogas. SIGNOS Y SNTOMAS  Hemorragia vaginal leve.  Dolor o clicos abdominales leves. DIAGNSTICO  Si tiene hemorragia con o sin dolor abdominal antes de las 20semanas de [place]embarazo, el mdico le har pruebas para determinar si el embarazo contina. Una prueba importante incluye el uso de ondas sonoras y de una computadora (ecografa) para crear imgenes del interior del tero. Otras pruebas incluyen el examen interno de la vagina y el tero (examen plvico), y el control de la frecuencia cardaca del feto.  Es posible que le diagnostiquen una amenaza de aborto en los  siguientes casos:  La ecografa muestra que el embarazo contina.  La frecuencia cardaca del feto es alta.  El examen plvico muestra que la apertura entre el tero y la vagina (cuello del tero) est cerrada.  Su frecuencia cardaca y su presin arterial estn estables.  Los ARAMARK Corporationanlisis de sangre confirman que el embarazo contina. TRATAMIENTO  No se ha demostrado que ningn tratamiento evite que una amenaza de aborto se [place] and Tobagoconvierta en un aborto completo. Sin embargo, los cuidados Starbucks Corporationadecuados en el hogar son importantes.  INSTRUCCIONES PARA EL CUIDADO EN EL HOGAR   Asegrese de asistir a todas las citas de cuidados prenatales. Esto es Intelmuy importante.  Descanse lo suficiente.  No tenga relaciones sexuales ni use tampones si tiene hemorragia vaginal.  No se haga duchas vaginales.  No fume ni consuma drogas.  No beba alcohol.  Evite la cafena. SOLICITE ATENCIN MDICA SI:  Tiene una ligera hemorragia o manchado vaginal durante el embarazo.  Tiene dolor o clicos en el abdomen.  Tiene fiebre. SOLICITE ATENCIN MDICA DE INMEDIATO SI:  Tiene una hemorragia vaginal abundante.  Elimina cogulos de sangre por la vagina.  Siente dolor en la parte baja de la espalda o clicos abdominales intensos.  Tiene fiebre, escalofros y dolor abdominal intenso. ASEGRESE DE QUE:  Comprende estas instrucciones.  Controlar su afeccin.  Recibir ayuda de inmediato si no mejora o si empeora.   Esta informacin no tiene Theme park managercomo fin reemplazar el consejo del mdico. Asegrese de hacerle al mdico cualquier pregunta que tenga.   Document Released: 01/19/2005 Document Revised: 04/16/2013 Elsevier Interactive Patient Education 2016 [employer]  Hemorragia vaginal durante el  embarazo (primer trimestre) °(Vaginal Bleeding During Pregnancy, First Trimester) °Durante los primeros meses del embarazo es relativamente frecuente que se presente una pequeña hemorragia (manchas). Esta situación  generalmente mejora por sí misma. Estas hemorragias o manchas tienen diversas causas al inicio del embarazo. Algunas manchas pueden estar relacionadas al embarazo y otras no. En la mayoría de los casos, la hemorragia es normal y no es un problema. Sin embargo, la hemorragia también puede ser un signo de algo grave. Debe informar a su médico de inmediato si tiene alguna hemorragia vaginal. °Algunas causas posibles de hemorragia vaginal durante el primer trimestre incluyen: °· Infección o inflamación del cuello del útero. °· Crecimientos anormales (pólipos) en el cuello del útero. °· Aborto espontáneo o amenaza de aborto espontáneo. °· Tejido del embarazo se ha desarrollado fuera del útero y en una trompa de falopio (embarazo ectópico). °· Se han desarrollado pequeños quistes en el útero en lugar de tejido de embarazo (embarazo molar). °INSTRUCCIONES PARA EL CUIDADO EN EL HOGAR  °Controle su afección para ver si hay cambios. Las siguientes indicaciones ayudarán a aliviar cualquier molestia que pueda sentir: °· Siga las indicaciones del médico para restringir su actividad. Si el médico le indica descanso en la cama, debe quedarse en la cama y levantarse solo para ir al baño. No obstante, el médico puede permitirle que continué con tareas livianas. °· Si es necesario, organícese para que alguien le ayude con las actividades y responsabilidades cotidianas mientras está en cama. °· Lleve un registro de la cantidad y la saturación de las toallas higiénicas que utiliza cada día. Anote este dato. °· No use tampones.No se haga duchas vaginales. °· No tenga relaciones sexuales u orgasmos hasta que el médico la autorice. °· Si elimina tejido por la vagina, guárdelo para mostrárselo al médico. °· Tome solo medicamentos de venta libre o recetados, según las indicaciones del médico. °· No tome aspirina, ya que puede causar hemorragias. °· Cumpla con todas las visitas de control, según le indique su médico. °SOLICITE ATENCIÓN  MÉDICA SI: °· Tiene un sangrado vaginal en cualquier momento del embarazo. °· Tiene calambres o dolores de parto. °· Tiene fiebre que los medicamentos no pueden controlar. °SOLICITE ATENCIÓN MÉDICA DE INMEDIATO SI:  °· Siente calambres intensos en la espalda o en el vientre (abdomen). °· Elimina coágulos grandes o tejido por la vagina. °· La hemorragia aumenta. °· Si se siente mareada, débil o se desmaya. °· Tiene escalofríos. °· Tiene una pérdida importante o sale líquido a borbotones por la vagina. °· Se desmaya mientras defeca. °ASEGÚRESE DE QUE: °· Comprende estas instrucciones. °· Controlará su afección. °· Recibirá ayuda de inmediato si no mejora o si empeora. °  °Esta información no tiene como fin reemplazar el consejo del médico. Asegúrese de hacerle al médico cualquier pregunta que tenga. °  °Document Released: 01/19/2005 Document Revised: 04/16/2013 °Elsevier Interactive Patient Education ©2016 Elsevier Inc. ° ° °

## 2015-03-28 LAB — HIV ANTIBODY (ROUTINE TESTING W REFLEX): HIV SCREEN 4TH GENERATION: NONREACTIVE

## 2015-03-30 ENCOUNTER — Other Ambulatory Visit (HOSPITAL_COMMUNITY): Payer: Self-pay | Admitting: Nurse Practitioner

## 2015-03-30 DIAGNOSIS — O3671X0 Maternal care for viable fetus in abdominal pregnancy, first trimester, not applicable or unspecified: Secondary | ICD-10-CM

## 2015-03-30 LAB — GC/CHLAMYDIA PROBE AMP (~~LOC~~) NOT AT ARMC
Chlamydia: NEGATIVE
NEISSERIA GONORRHEA: NEGATIVE

## 2015-03-30 LAB — OB RESULTS CONSOLE HIV ANTIBODY (ROUTINE TESTING): HIV: NONREACTIVE

## 2015-03-30 LAB — OB RESULTS CONSOLE RUBELLA ANTIBODY, IGM: RUBELLA: IMMUNE

## 2015-03-30 LAB — OB RESULTS CONSOLE ABO/RH: RH Type: POSITIVE

## 2015-03-30 LAB — OB RESULTS CONSOLE GC/CHLAMYDIA
Chlamydia: NEGATIVE
Gonorrhea: NEGATIVE

## 2015-03-30 LAB — OB RESULTS CONSOLE HGB/HCT, BLOOD
HEMATOCRIT: 37 %
Hemoglobin: 12.4 g/dL

## 2015-03-30 LAB — OB RESULTS CONSOLE HEPATITIS B SURFACE ANTIGEN: Hepatitis B Surface Ag: NEGATIVE

## 2015-03-30 LAB — OB RESULTS CONSOLE VARICELLA ZOSTER ANTIBODY, IGG: VARICELLA IGG: IMMUNE

## 2015-03-30 LAB — OB RESULTS CONSOLE RPR: RPR: NONREACTIVE

## 2015-03-30 LAB — OB RESULTS CONSOLE PLATELET COUNT: PLATELETS: 304 10*3/uL

## 2015-03-30 LAB — OB RESULTS CONSOLE ANTIBODY SCREEN: ANTIBODY SCREEN: NEGATIVE

## 2015-03-31 ENCOUNTER — Other Ambulatory Visit (HOSPITAL_COMMUNITY): Payer: Self-pay | Admitting: Nurse Practitioner

## 2015-03-31 ENCOUNTER — Ambulatory Visit (HOSPITAL_COMMUNITY)
Admission: RE | Admit: 2015-03-31 | Discharge: 2015-03-31 | Disposition: A | Discharge status: Home / Self Care | Payer: Self-pay | Source: Ambulatory Visit | Attending: Nurse Practitioner | Admitting: Nurse Practitioner

## 2015-03-31 DIAGNOSIS — O468X1 Other antepartum hemorrhage, first trimester: Secondary | ICD-10-CM

## 2015-03-31 DIAGNOSIS — Z3A10 10 weeks gestation of pregnancy: Secondary | ICD-10-CM | POA: Insufficient documentation

## 2015-03-31 DIAGNOSIS — O208 Other hemorrhage in early pregnancy: Secondary | ICD-10-CM | POA: Insufficient documentation

## 2015-03-31 DIAGNOSIS — O418X10 Other specified disorders of amniotic fluid and membranes, first trimester, not applicable or unspecified: Secondary | ICD-10-CM

## 2015-03-31 DIAGNOSIS — O3680X0 Pregnancy with inconclusive fetal viability, not applicable or unspecified: Secondary | ICD-10-CM

## 2015-04-26 NOTE — L&D Delivery Note (Signed)
Delivery Note Pt was complete but still intact; after AROM she pushed with the next contraction and at 4:09 AM a viable female was delivered via Vaginal, Spontaneous Delivery (Presentation: Right Occiput Anterior).  APGAR: 8, 9; weight 5 lb 14.7 oz (2685 g).  Cord clamped and cut by FOB. Hospital cord blood sample collected. Placenta status: Intact, Spontaneous.  Cord: 3 vessels   Anesthesia: None  Episiotomy: None Lacerations: None Est. Blood Loss (mL): 150  Mom to postpartum.  Baby to Couplet care / Skin to Skin.   Cam HaiSHAW, KIMBERLY CNM 10/15/2015, 4:19 AM

## 2015-04-27 ENCOUNTER — Encounter: Payer: Self-pay | Admitting: Family Medicine

## 2015-04-27 ENCOUNTER — Ambulatory Visit (INDEPENDENT_AMBULATORY_CARE_PROVIDER_SITE_OTHER): Payer: Self-pay | Admitting: Family Medicine

## 2015-04-27 VITALS — BP 106/89 | HR 68 | Temp 97.9°F | Wt 212.0 lb

## 2015-04-27 DIAGNOSIS — O0992 Supervision of high risk pregnancy, unspecified, second trimester: Secondary | ICD-10-CM

## 2015-04-27 DIAGNOSIS — O09292 Supervision of pregnancy with other poor reproductive or obstetric history, second trimester: Secondary | ICD-10-CM

## 2015-04-27 DIAGNOSIS — O09299 Supervision of pregnancy with other poor reproductive or obstetric history, unspecified trimester: Secondary | ICD-10-CM | POA: Insufficient documentation

## 2015-04-27 LAB — POCT URINALYSIS DIP (DEVICE)
BILIRUBIN URINE: NEGATIVE
GLUCOSE, UA: NEGATIVE mg/dL
KETONES UR: NEGATIVE mg/dL
Nitrite: NEGATIVE
PROTEIN: NEGATIVE mg/dL
SPECIFIC GRAVITY, URINE: 1.025 (ref 1.005–1.030)
Urobilinogen, UA: 1 mg/dL (ref 0.0–1.0)
pH: 7 (ref 5.0–8.0)

## 2015-04-27 NOTE — Progress Notes (Signed)
Patient reports bleeding off and on for past two months; states bleeding happens if she walks too long or does housework. States when bleeding happens she cramps and bleeding is more than a bleeding.  Elane FritzBlanca used for interpreter  New ob packet given

## 2015-04-27 NOTE — Progress Notes (Signed)
Spanish interpreter: Okey Regalarol used Subjective:  Bridget Baird is a 25 y.o. G3P2002 at 6012w0d being seen today [facility] to previous SGA. Our records indicate pregestational diabetes, although the patient denies this. [facility], but we do not have records today.  She is currently monitored for the following issues for this high-risk pregnancy and has Hirsutism; Weight gain; Headache(784.0); PCOS (polycystic ovarian syndrome); Supervision of high risk pregnancy in second trimester; Anovulation; and Prior pregnancy complicated by SGA (small for gestational age), antepartum on her problem list.  [facility]  Contractions: Not present. Vag. Bleeding: Moderate.  Denies leaking of fluid.   The following portions of the patient's history were reviewed and updated as appropriate: allergies, current medications, past family history, past medical history, past social history, past surgical history and problem list. Problem list updated.  Objective:   Filed Vitals:   04/27/15 0922  BP: 106/89  Pulse: 68  Temp: 97.9 F (36.6 C)  Weight: 212 lb (96.163 kg)    Fetal Status: Fetal Heart Rate (bpm): 138         General:  Alert, oriented and cooperative. Patient is in no acute distress.  Skin: Skin is warm and dry. No rash noted.   Cardiovascular: Normal heart rate noted  Respiratory: Normal respiratory effort, no problems with respiration noted  Abdomen: Soft, gravid, appropriate for gestational age. Pain/Pressure: Present     Pelvic: Vag. Bleeding: Moderate Vag D/C Character: White   Cervical exam deferred        Extremities: Normal range of motion.  Edema: Trace  Mental Status: Normal mood and affect. Normal behavior. Normal judgment and thought content.    Assessment and Plan:  Pregnancy: G3P2002 at 3312w0d  1. Supervision of high risk pregnancy in second trimester Will get records and proceed  accordingly  2. Prior pregnancy complicated by SGA (small for gestational age), antepartum, second trimester Monitor FH's   Threatened AB symptoms and general obstetric precautions including but not limited to vaginal bleeding, contractions, leaking of fluid and fetal movement were reviewed in detail with the patient. Please refer to After Visit Summary for other counseling recommendations.  Return in 4 weeks (on 05/25/2015).   Reva Boresanya S Jolon Degante, MD

## 2015-04-27 NOTE — Patient Instructions (Signed)
Segundo trimestre de Media planner (Second Trimester of Pregnancy) El segundo trimestre va desde la semana13 hasta la 48, desde el cuarto hasta el sexto mes, y suele ser el momento en el que mejor se siente. Su organismo se ha adaptado a Public relations account executive y comienza a Print production planner. En general, las nuseas matutinas han disminuido o han desaparecido completamente, puede tener ms energa y un aumento de apetito. El segundo trimestre es tambin la poca en la que el feto se desarrolla rpidamente. Hacia el final del sexto mes, el feto mide aproximadamente 9pulgadas (23cm) y pesa alrededor de 1 libras (700g). Es probable que sienta que el beb se Software engineer (da pataditas) entre las 108 y 20semanas del Media planner. CAMBIOS EN EL ORGANISMO Su organismo atraviesa por muchos cambios durante el [place], y estos varan de Ardelia Mems mujer a Theatre manager.   Seguir [employer]. Notar que la parte baja del abdomen sobresale.  Podrn aparecer las primeras Apache Corporation caderas, el abdomen y las [place].  Es posible que tenga dolores de cabeza que pueden aliviarse con los medicamentos que el mdico le permita tomar.  Tal vez tenga necesidad de orinar con ms frecuencia porque el feto est ejerciendo presin Field seismologist.  Debido al Glennis Brink podr sentir Victorio Palm estomacal con frecuencia.  Puede estar estreida, ya que ciertas hormonas enlentecen los movimientos de los msculos que JPM[employer] Co desechos a travs de los intestinos.  Pueden aparecer hemorroides o abultarse e hincharse las venas (venas varicosas).  Puede tener dolor de espalda que se debe al Southern Company de peso y a que las hormonas del Scientist, research (life sciences) las articulaciones entre los huesos de la pelvis, y Civil Service fast streamer consecuencia de la modificacin del peso y los msculos que mantienen el equilibrio.  Las Lincoln National Corporation seguirn creciendo y Teaching laboratory technician.  Las Production manager y estar sensibles al cepillado y al hilo dental.  Pueden aparecer zonas oscuras o  manchas (cloasma, mscara del Media planner) en el rostro que probablemente se atenuar despus del nacimiento del beb.  Es posible que se forme una lnea oscura desde el ombligo hasta la zona del pubis (linea nigra) que probablemente se atenuar despus del nacimiento del beb.  Tal vez haya cambios en el cabello que pueden incluir su engrosamiento, crecimiento rpido y cambios en la textura. Adems, a algunas mujeres se les cae el cabello durante o despus del embarazo, o tienen el cabello seco o fino. Lo ms probable es que el cabello se le normalice despus del nacimiento del beb. QU DEBE ESPERAR EN LAS CONSULTAS PRENATALES Durante una visita prenatal de rutina:  La pesarn para asegurarse de que usted y el feto estn creciendo normalmente.  Le tomarn la presin arterial.  Le medirn el abdomen para controlar el desarrollo del beb.  Se escucharn los latidos cardacos fetales.  Se evaluarn los resultados de los estudios solicitados en visitas anteriores. El mdico puede preguntarle lo siguiente:  Cmo se siente.  Si siente los movimientos del beb.  Si ha tenido sntomas anormales, como prdida de lquido, [place], dolores de cabeza intensos o clicos abdominales.  Si est consumiendo algn producto que contenga tabaco, como cigarrillos, tabaco de Higher education careers adviser y Psychologist, sport and exercise.  Si tiene [employer]. Otros estudios que podrn realizarse durante el segundo trimestre incluyen lo siguiente:  Anlisis de sangre para detectar lo siguiente:  Concentraciones de hierro bajas (anemia).  Diabetes gestacional (entre la semana 24 y la 22).  Anticuerpos Rh.  Anlisis de orina para detectar infecciones, diabetes o protenas en la  orina.  Una ecografa para confirmar que el beb crece y se desarrolla correctamente.  Una amniocentesis para diagnosticar posibles problemas genticos.  Estudios del feto para descartar espina bfida y sndrome de Down.  Prueba del VIH (virus  de inmunodeficiencia humana). Los exmenes prenatales de rutina incluyen la prueba de deteccin del VIH, a menos que decida no realizrsela. INSTRUCCIONES PARA EL CUIDADO EN EL HOGAR   Evite fumar, consumir hierbas, beber alcohol y tomar frmacos que no le hayan recetado. Estas sustancias qumicas afectan la formacin y el desarrollo del beb.  No consuma ningn producto que contenga tabaco, lo que incluye cigarrillos, tabaco de mascar y cigarrillos electrnicos. Si necesita ayuda para dejar de fumar, consulte al mdico. Puede recibir asesoramiento y otro tipo de recursos para dejar de fumar.  Siga las indicaciones del mdico en relacin con el uso de medicamentos. Durante el embarazo, hay medicamentos que son seguros de tomar y otros que no.  Haga ejercicio solamente como se lo haya indicado el mdico. Sentir clicos uterinos es un buen signo para detener la actividad fsica.  Contine comiendo alimentos sanos con regularidad.  Use un sostn que le brinde buen soporte si le duelen las mamas.  No se d baos de inmersin en agua caliente, baos turcos ni saunas.  Use el cinturn de seguridad en todo momento mientras conduce.  No coma carne cruda ni queso sin cocinar; evite el contacto con las bandejas sanitarias de los gatos y la tierra que estos animales usan. Estos elementos contienen grmenes que pueden causar defectos congnitos en el beb.  Tome las vitaminas prenatales.  Tome entre 1500 y 2000mg de calcio diariamente comenzando en la semana20 del embarazo hasta el parto.  Si est estreida, pruebe un laxante suave (si el mdico lo autoriza). Consuma ms alimentos ricos en fibra, como vegetales y frutas frescos y cereales integrales. Beba gran cantidad de lquido para mantener la orina de tono claro o color amarillo plido.  Dese baos de asiento con agua tibia para aliviar el dolor o las molestias causadas por las hemorroides. Use una crema para las hemorroides si el mdico la  autoriza.  Si tiene venas varicosas, use medias de descanso. Eleve los pies durante 15minutos, 3 o 4veces por da. Limite el consumo de sal en su dieta.  No levante objetos pesados, use zapatos de tacones bajos y mantenga una buena postura.  Descanse con las piernas elevadas si tiene calambres o dolor de cintura.  Visite a su dentista si an no lo ha hecho durante el embarazo. Use un cepillo de dientes blando para higienizarse los dientes y psese el hilo dental con suavidad.  Puede seguir manteniendo relaciones sexuales, a menos que el mdico le indique lo contrario.  Concurra a todas las visitas prenatales segn las indicaciones de su mdico. SOLICITE ATENCIN MDICA SI:   Tiene mareos.  Siente clicos leves, presin en la pelvis o dolor persistente en el abdomen.  Tiene nuseas, vmitos o diarrea persistentes.  Observa una secrecin vaginal con mal olor.  Siente dolor al orinar. SOLICITE ATENCIN MDICA DE INMEDIATO SI:   Tiene fiebre.  Tiene una prdida de lquido por la vagina.  Tiene sangrado o pequeas prdidas vaginales.  Siente dolor intenso o clicos en el abdomen.  Sube o baja de peso rpidamente.  Tiene dificultad para respirar y siente dolor de pecho.  Sbitamente se le hinchan mucho el rostro, las manos, los tobillos, los pies o las piernas.  No ha sentido los movimientos del beb durante   una hora.  Siente un dolor de cabeza intenso que no se alivia con medicamentos.  Su visin se modifica.   Esta informacin no tiene como fin reemplazar el consejo del mdico. Asegrese de hacerle al mdico cualquier pregunta que tenga.   Document Released: 01/19/2005 Document Revised: 05/02/2014 Elsevier Interactive Patient Education 2016 Elsevier Inc.   Lactancia materna (Breastfeeding) Decidir amamantar es una de las mejores elecciones que puede hacer por usted y su beb. El cambio hormonal durante el embarazo produce el desarrollo del tejido mamario y aumenta  la cantidad y el tamao de los conductos galactforos. Estas hormonas tambin permiten que las protenas, los azcares y las grasas de la sangre produzcan la leche materna en las glndulas productoras de leche. Las hormonas impiden que la leche materna sea liberada antes del nacimiento del beb, adems de impulsar el flujo de leche luego del nacimiento. Una vez que ha comenzado a amamantar, pensar en el beb, as como la succin o el llanto, pueden estimular la liberacin de leche de las glndulas productoras de leche.  LOS BENEFICIOS DE AMAMANTAR Para el beb  La primera leche (calostro) ayuda a mejorar el funcionamiento del sistema digestivo del beb.  La leche tiene anticuerpos que ayudan a prevenir las infecciones en el beb.  El beb tiene una menor incidencia de asma, alergias y del sndrome de muerte sbita del lactante.  Los nutrientes en la leche materna son mejores para el beb que la leche maternizada y estn preparados exclusivamente para cubrir las necesidades del beb.  La leche materna mejora el desarrollo cerebral del beb.  Es menos probable que el beb desarrolle otras enfermedades, como obesidad infantil, asma o diabetes mellitus de tipo 2. Para usted   La lactancia materna favorece el desarrollo de un vnculo muy especial entre la madre y el beb.  Es conveniente. La leche materna siempre est disponible a la temperatura correcta y es econmica.  La lactancia materna ayuda a quemar caloras y a perder el peso ganado durante el embarazo.  Favorece la contraccin del tero al tamao que tena antes del embarazo de manera ms rpida y disminuye el sangrado (loquios) despus del parto.  La lactancia materna contribuye a reducir el riesgo de desarrollar diabetes mellitus de tipo 2, osteoporosis o cncer de mama o de ovario en el futuro. SIGNOS DE QUE EL BEB EST HAMBRIENTO Primeros signos de hambre  Aumenta su estado de alerta o actividad.  Se estira.  Mueve la cabeza  de un lado a otro.  Mueve la cabeza y abre la boca cuando se le toca la mejilla o la comisura de la boca (reflejo de bsqueda).  Aumenta las vocalizaciones, tales como sonidos de succin, se relame los labios, emite arrullos, suspiros, o chirridos.  Mueve la mano hacia la boca.  Se chupa con ganas los dedos o las manos. Signos tardos de hambre  Est agitado.  Llora de manera intermitente. Signos de hambre extrema Los signos de hambre extrema requerirn que lo calme y lo consuele antes de que el beb pueda alimentarse adecuadamente. No espere a que se manifiesten los siguientes signos de hambre extrema para comenzar a amamantar:   Agitacin.  Llanto intenso y fuerte.  Gritos. INFORMACIN BSICA SOBRE LA LACTANCIA MATERNA Iniciacin de la lactancia materna  Encuentre un lugar cmodo para sentarse o acostarse, con un buen respaldo para el cuello y la espalda.  Coloque una almohada o una manta enrollada debajo del beb para acomodarlo a la altura de la mama (si   est sentada). Las almohadas para amamantar se han diseado especialmente a fin de servir de apoyo para los brazos y el beb mientras amamanta.  Asegrese de que el abdomen del beb est frente al suyo.   Masajee suavemente la mama. Con las yemas de los dedos, masajee la pared del pecho hacia el pezn en un movimiento circular. Esto estimula el flujo de leche. Es posible que deba continuar este movimiento mientras amamanta si la leche fluye lentamente.  Sostenga la mama con el pulgar por arriba del pezn y los otros 4 dedos por debajo de la mama. Asegrese de que los dedos se encuentren lejos del pezn y de la boca del beb.  Empuje suavemente los labios del beb con el pezn o con el dedo.  Cuando la boca del beb se abra lo suficiente, acrquelo rpidamente a la mama e introduzca todo el pezn y la zona oscura que lo rodea (areola), tanto como sea posible, dentro de la boca del beb.  Debe haber ms areola visible por  arriba del labio superior del beb que por debajo del labio inferior.  La lengua del beb debe estar entre la enca inferior y la mama.  Asegrese de que la boca del beb est en la posicin correcta alrededor del pezn (prendida). Los labios del beb deben crear un sello sobre la mama y estar doblados hacia afuera (invertidos).  Es comn que el beb succione durante 2 a 3 minutos para que comience el flujo de leche materna. Cmo debe prenderse Es muy importante que le ensee al beb cmo prenderse adecuadamente a la mama. Si el beb no se prende adecuadamente, puede causarle dolor en el pezn y reducir la produccin de leche materna, y hacer que el beb tenga un escaso aumento de peso. Adems, si el beb no se prende adecuadamente al pezn, puede tragar aire durante la alimentacin. Esto puede causarle molestias al beb. Hacer eructar al beb al cambiar de mama puede ayudarlo a liberar el aire. Sin embargo, ensearle al beb cmo prenderse a la mama adecuadamente es la mejor manera de evitar que se sienta molesto por tragar aire mientras se alimenta. Signos de que el beb se ha prendido adecuadamente al pezn:   Tironea o succiona de modo silencioso, sin causarle dolor.  Se escucha que traga cada 3 o 4 succiones.  Hay movimientos musculares por arriba y por delante de sus odos al succionar. Signos de que el beb no se ha prendido adecuadamente al pezn:   Hace ruidos de succin o de chasquido mientras se alimenta.  Siente dolor en el pezn. Si cree que el beb no se prendi correctamente, deslice el dedo en la comisura de la boca y colquelo entre las encas del beb para interrumpir la succin. Intente comenzar a amamantar nuevamente. Signos de lactancia materna exitosa Signos del beb:   Disminuye gradualmente el nmero de succiones o cesa la succin por completo.  Se duerme.  Relaja el cuerpo.  Retiene una pequea cantidad de leche en la boca.  Se desprende solo del  pecho. Signos que presenta usted:  Las mamas han aumentado la firmeza, el peso y el tamao 1 a 3 horas despus de amamantar.  Estn ms blandas inmediatamente despus de amamantar.  Un aumento del volumen de leche, y tambin un cambio en su consistencia y color se producen hacia el quinto da de lactancia materna.  Los pezones no duelen, ni estn agrietados ni sangran. Signos de que su beb recibe la cantidad de leche   Moja al menos 3 paales en 24 horas. La orina debe ser clara y de color amarillo plido a los [address]  [address] de [place]ida.  Defeca al menos 3 veces en 24 horas a los [address]  [address] de [address]. La materia fecal debe ser blanda y Elktonamarillenta.  Defeca al menos 3 veces en 24 horas a los [address] das de [address]. La materia fecal debe ser grumosa y Saginawamarillenta.  No registra una prdida de peso mayor del 10% del peso al nacer durante los primeros 3 [address]  [address] de [place]ida.  Aumenta de peso un promedio de 4 a 7onzas (113 a 198g) por semana despus de los [address]  [address] de vida.  Aumenta de Graziervillepeso, [place] Mariondiariamente, de Dannebrogmanera uniforme a Glass blower/designerpartir de los [address]  [address] de vida, sin Passenger transport managerregistrar prdida de peso despus de las 2semanas de vida. Despus de alimentarse, es posible que el beb regurgite una pequea cantidad. Esto es frecuente. FRECUENCIA Y DURACIN DE LA LACTANCIA MATERNA El amamantamiento frecuente la ayudar a producir ms Azerbaijanleche y a Education officer, communityprevenir problemas de Engineer, miningdolor en los pezones e hinchazn en las [place]villemamas. Alimente al beb cuando muestre signos de hambre o si siente la necesidad de reducir la congestin de las Watongamamas. Esto se denomina "lactancia a demanda". Evite el uso del chupete mientras trabaja para establecer la lactancia (las primeras 4 a 6 semanas despus del nacimiento del beb). Despus de este perodo, podr ofrecerle un chupete. Las investigaciones demostraron que el uso del chupete durante el primer ao de vida del beb disminuye el riesgo de desarrollar el sndrome de muerte sbita del lactante (SMSL). Permita que el nio  se alimente en cada mama todo lo que desee. Contine amamantando al beb hasta que haya terminado de alimentarse. Cuando el beb se desprende o se queda dormido mientras se est alimentando de la primera mama, ofrzcale la segunda. Debido a que, con frecuencia, los recin Sunoconacidos permanecen somnolientos las primeras semanas de vida, es posible que deba despertar al beb para alimentarlo. Los horarios de Acupuncturistlactancia varan de un beb a otro. Sin embargo, las siguientes reglas pueden servir como gua para ayudarla a Lawyergarantizar que el beb se alimenta adecuadamente:  Se puede amamantar a los recin nacidos (bebs de 4 semanas o menos de vida) cada 1 a 3 horas.  No deben transcurrir ms de 3 horas durante el da o 5 horas durante la noche sin que se amamante a los recin nacidos.  Debe amamantar al beb 8 veces como mnimo en un perodo de 24 horas, hasta que comience a introducir slidos en su dieta, a los 6 meses de vida aproximadamente. EXTRACCIN DE Dean Foods CompanyLECHE MATERNA La extraccin y Contractorel almacenamiento de la leche materna le permiten asegurarse de que el beb se alimente exclusivamente de Natural Bridgeleche materna, aun en momentos en los que no puede amamantar. Esto tiene especial importancia si debe regresar al Aleen Campitrabajo en el perodo en que an est amamantando o si no puede estar presente en los momentos en que el beb debe alimentarse. Su asesor en lactancia puede orientarla sobre cunto tiempo es seguro almacenar Corleyleche materna.  El sacaleche es un aparato que le permite extraer leche de la mama a un recipiente estril. Luego, la leche materna extrada puede almacenarse en un refrigerador o Electrical engineercongelador. Algunos sacaleches son Birdie Riddlemanuales, Delaney Meigsmientras que otros son elctricos. Consulte a su asesor en lactancia qu tipo ser ms conveniente para usted. Los sacaleches se pueden comprar; sin embargo, algunos hospitales y grupos de apoyo a la lactancia materna alquilan Sports coachsacaleches mensualmente. Un asesor en  lactancia puede ensearle  cmo extraer W. R. Berkleyleche materna manualmente, en caso de que prefiera no usar un sacaleche.  CMO CUIDAR LAS MAMAS DURANTE LA LACTANCIA MATERNA Los pezones se secan, agrietan y duelen durante la Tour managerlactancia materna. Las siguientes recomendaciones pueden ayudarla a Pharmacologistmantener las TEPPCO Partnersmamas humectadas y sanas:  Careers information officervite usar jabn en los pezones.  Use un sostn de soporte. Aunque no son esenciales, las camisetas sin mangas o los sostenes especiales para Museum/gallery exhibitions officeramamantar estn diseados para acceder fcilmente a las mamas, para Museum/gallery exhibitions officeramamantar sin tener que quitarse todo el sostn o la camiseta. Evite usar sostenes con aro o sostenes muy ajustados.  Seque al aire sus pezones durante 3 a 4minutos despus de amamantar al beb.  Utilice solo apsitos de Haematologistalgodn en el sostn para Environmental health practitionerabsorber las prdidas de Lake Isabellaleche. La prdida de un poco de Public Service Enterprise Groupleche materna entre las tomas es normal.  Utilice lanolina sobre los pezones luego de Museum/gallery exhibitions officeramamantar. La lanolina ayuda a mantener la humedad normal de la piel. Si Botswanausa lanolina pura, no tiene que lavarse los pezones antes de volver a Corporate treasureralimentar al beb. La lanolina pura no es txica para el beb. Adems, puede extraer Beazer Homesmanualmente algunas gotas de Parlierleche materna y Engineer, maintenance (IT)masajear suavemente esa Winn-Dixieleche sobre los pezones, para que la Pukwanaleche se seque al aire. Durante las primeras semanas despus de dar a luz, algunas mujeres pueden experimentar hinchazn en las mamas (congestin Great Meadowsmamaria). La congestin puede hacer que sienta las mamas pesadas, calientes y sensibles al tacto. El pico de la congestin ocurre dentro de los 3 a 5 das despus del Swinkparto. Las siguientes recomendaciones pueden ayudarla a Paramedicaliviar la congestin:  Vace por completo las mamas al QUALCOMMamamantar o Environmental health practitionerextraer leche. Puede aplicar calor hmedo en las mamas (en la ducha o con toallas hmedas para manos) antes de Museum/gallery exhibitions officeramamantar o extraer WPS Resourcesleche. Esto aumenta la circulacin y Saint Vincent and the Grenadinesayuda a que la Providenceleche fluya. Si el beb no vaca por completo las [address] D[address]amas cuando lo [address] Aveamamanta,  extraiga la Bennettleche restante despus de que haya finalizado.  Use un sostn ajustado (para amamantar o comn) o una camiseta sin mangas durante 1 o 2 das para indicar al cuerpo que disminuya ligeramente la produccin de Level Park-Oak Parkleche.  Aplique compresas de hielo Yahoo! Incsobre las mamas, a menos que le resulte demasiado incmodo.  Asegrese de que el beb est prendido y se encuentre en la posicin correcta mientras lo alimenta. Si la congestin persiste luego de 48 horas o despus de seguir estas recomendaciones, comunquese con su mdico o un Holiday representativeasesor en lactancia. RECOMENDACIONES GENERALES PARA EL CUIDADO DE LA SALUD DURANTE LA LACTANCIA MATERNA  Consuma alimentos saludables. Alterne comidas y colaciones, y coma 3 de cada una por da. Dado que lo que come Danaher Corporationafecta la leche materna, es posible que algunas comidas hagan que su beb se vuelva ms irritable de lo habitual. Evite comer este tipo de alimentos si percibe que afectan de manera negativa al beb.  Beba leche, jugos de fruta y agua para Patent examinersatisfacer su sed (aproximadamente 10 vasos al Futures traderda).  Descanse con frecuencia, reljese y tome sus vitaminas prenatales para evitar la fatiga, el estrs y la anemia.  Contine con los autocontroles de la mama.  Evite Product managermasticar y fumar tabaco. Las sustancias qumicas de los cigarrillos que pasan a la leche materna y la exposicin al humo ambiental del tabaco pueden daar al beb.  No consuma alcohol ni drogas, incluida la marihuana. Algunos medicamentos, que pueden ser perjudiciales para el beb, pueden pasar a travs de la Colgate Palmoliveleche materna. Es importante  que consulte a su mdico antes de Medical sales representativetomar cualquier medicamento, incluidos todos los medicamentos recetados y de Milanventa libre, as como los suplementos vitamnicos y herbales. Puede quedar embarazada durante la lactancia. Si desea controlar la natalidad, consulte a su mdico cules son las opciones ms seguras para el beb. SOLICITE ATENCIN MDICA SI:   Usted siente que quiere  dejar de Museum/gallery exhibitions officeramamantar o se siente frustrada con la lactancia.  Siente dolor en las mamas o en los pezones.  Sus pezones estn agrietados o Water quality scientistsangran.  Sus pechos estn irritados, sensibles o calientes.  Tiene un rea hinchada en cualquiera de las mamas.  Siente escalofros o fiebre.  Tiene nuseas o vmitos.  Presenta una secrecin de otro lquido distinto de la leche materna de los pezones.  Sus mamas no se llenan antes de Museum/gallery exhibitions officeramamantar al beb para el quinto da despus del Geneseoparto.  Se siente triste y deprimida.  El beb est demasiado somnoliento como para comer bien.  El beb tiene problemas para dormir.  Moja menos de 3 paales en 24 horas.  Defeca menos de 3 veces en 24 horas.  La piel del beb o la parte blanca de los ojos se vuelven amarillentas.  El beb no ha aumentado de Kerrpeso a los [address] das de [place]ida. SOLICITE ATENCIN MDICA DE INMEDIATO SI:   El beb est muy cansado Retail buyer(letargo) y no se quiere despertar para comer.  Le sube la fiebre sin causa.   Esta informacin no tiene Theme park managercomo fin reemplazar el consejo del mdico. Asegrese de hacerle al mdico cualquier pregunta que tenga.   Document Released: 04/11/2005 Document Revised: 12/31/2014 Elsevier Interactive Patient Education Yahoo[employer] Inc2016 Elsevier Inc.

## 2015-04-28 ENCOUNTER — Encounter: Payer: Self-pay | Admitting: *Deleted

## 2015-04-28 DIAGNOSIS — O09299 Supervision of pregnancy with other poor reproductive or obstetric history, unspecified trimester: Secondary | ICD-10-CM

## 2015-04-28 DIAGNOSIS — Z8632 Personal history of gestational diabetes: Secondary | ICD-10-CM

## 2015-04-28 DIAGNOSIS — O0992 Supervision of high risk pregnancy, unspecified, second trimester: Secondary | ICD-10-CM

## 2015-05-25 ENCOUNTER — Encounter: Payer: Self-pay | Admitting: Family Medicine

## 2015-06-10 ENCOUNTER — Ambulatory Visit (INDEPENDENT_AMBULATORY_CARE_PROVIDER_SITE_OTHER): Payer: Self-pay | Admitting: Family

## 2015-06-10 VITALS — BP 107/60 | HR 68 | Temp 98.9°F | Wt 213.8 lb

## 2015-06-10 DIAGNOSIS — O0992 Supervision of high risk pregnancy, unspecified, second trimester: Secondary | ICD-10-CM

## 2015-06-10 DIAGNOSIS — O09292 Supervision of pregnancy with other poor reproductive or obstetric history, second trimester: Secondary | ICD-10-CM

## 2015-06-10 LAB — POCT URINALYSIS DIP (DEVICE)
BILIRUBIN URINE: NEGATIVE
GLUCOSE, UA: NEGATIVE mg/dL
Hgb urine dipstick: NEGATIVE
Ketones, ur: NEGATIVE mg/dL
NITRITE: NEGATIVE
PH: 7 (ref 5.0–8.0)
Protein, ur: 30 mg/dL — AB
Specific Gravity, Urine: 1.025 (ref 1.005–1.030)
Urobilinogen, UA: 1 mg/dL (ref 0.0–1.0)

## 2015-06-10 NOTE — Progress Notes (Signed)
Subjective:  Bridget Baird is a 25 y.o. G3P2002 at [redacted]w[redacted]d being seen today for ongoing prenatal care.  She is currently monitored for the following issues for this high-risk pregnancy and has supervision of high risk pregnancy in second trimester; prior pregnancy complicated by SGA (small for gestational age), antepartum; and History of gestational diabetes on her problem list.  Patient reports no complaints.  Contractions: Not present.  .  Movement: Absent. Denies leaking of fluid.   The following portions of the patient's history were reviewed and updated as appropriate: allergies, current medications, past family history, past medical history, past social history, past surgical history and problem list. Problem list updated.  Objective:   Filed Vitals:   06/10/15 1019  BP: 107/60  Pulse: 68  Temp: 98.9 F (37.2 C)  Weight: 213 lb 12.8 oz (96.979 kg)    Fetal Status: Fetal Heart Rate (bpm): 145 Fundal Height: 21 cm Movement: Absent     General:  Alert, oriented and cooperative. Patient is in no acute distress.  Skin: Skin is warm and dry. No rash noted.   Cardiovascular: Normal heart rate noted  Respiratory: Normal respiratory effort, no problems with respiration noted  Abdomen: Soft, gravid, appropriate for gestational age. Pain/Pressure: Present     Pelvic:       Cervical exam deferred        Extremities: Normal range of motion.     Mental Status: Normal mood and affect. Normal behavior. Normal judgment and thought content.   Urinalysis: Urine Protein: 1+ Urine Glucose: Negative  Assessment and Plan:  Pregnancy: G3P2002 at [redacted]w[redacted]d  1. Supervision of high-risk pregnancy, second trimester - AFP, Quad Screen - [place] MFM OB DETAIL +14 WK; Future  2. Supervision of high risk pregnancy in second trimester - [place] MFM OB DETAIL +14 WK; Future  3. Prior pregnancy complicated by SGA (small for gestational age), antepartum, second trimester - Ultrasound scheduled  General obstetric  precautions including but not limited to vaginal bleeding and pelvic pain reviewed in detail with the patient. Please refer to After Visit Summary for other counseling recommendations.  Return in about 4 weeks (around 07/08/2015).   Eino Farber Kennith Gain, CNM

## 2015-06-11 LAB — AFP, QUAD SCREEN
AFP: 32.5 ng/mL
Curr Gest Age: 20.2 wks.days
Down Syndrome Scr Risk Est: 1:19900 {titer}
HCG TOTAL: 9.11 [IU]/mL
INH: 66 pg/mL
Interpretation-AFP: NEGATIVE
MOM FOR AFP: 0.69
MOM FOR HCG: 0.52
MOM FOR INH: 0.42
OPEN SPINA BIFIDA: NEGATIVE
Osb Risk: <1:27300 {titer}
TRI 18 SCR RISK EST: NEGATIVE
Trisomy 18 (Edward) Syndrome Interp.: 1:12600 {titer}
uE3 Mom: 0.99
uE3 Value: 1.79 ng/mL

## 2015-06-16 ENCOUNTER — Encounter (HOSPITAL_COMMUNITY): Payer: Self-pay

## 2015-06-16 ENCOUNTER — Other Ambulatory Visit: Payer: Self-pay | Admitting: General Practice

## 2015-06-16 ENCOUNTER — Ambulatory Visit (HOSPITAL_COMMUNITY)
Admission: RE | Admit: 2015-06-16 | Discharge: 2015-06-16 | Disposition: A | Discharge status: Home / Self Care | Payer: Self-pay | Source: Ambulatory Visit | Attending: Family | Admitting: Family

## 2015-06-16 VITALS — BP 98/52 | HR 79 | Wt 217.6 lb

## 2015-06-16 DIAGNOSIS — Z3A21 21 weeks gestation of pregnancy: Secondary | ICD-10-CM | POA: Insufficient documentation

## 2015-06-16 DIAGNOSIS — Z8632 Personal history of gestational diabetes: Secondary | ICD-10-CM

## 2015-06-16 DIAGNOSIS — Z3689 Encounter for other specified antenatal screening: Secondary | ICD-10-CM

## 2015-06-16 DIAGNOSIS — Z36 Encounter for antenatal screening of mother: Secondary | ICD-10-CM | POA: Insufficient documentation

## 2015-06-16 DIAGNOSIS — O09299 Supervision of pregnancy with other poor reproductive or obstetric history, unspecified trimester: Secondary | ICD-10-CM

## 2015-06-16 DIAGNOSIS — O09292 Supervision of pregnancy with other poor reproductive or obstetric history, second trimester: Secondary | ICD-10-CM | POA: Insufficient documentation

## 2015-06-16 DIAGNOSIS — O0992 Supervision of high risk pregnancy, unspecified, second trimester: Secondary | ICD-10-CM

## 2015-07-08 ENCOUNTER — Telehealth: Payer: Self-pay | Admitting: Family

## 2015-07-08 ENCOUNTER — Encounter: Payer: Self-pay | Admitting: Family

## 2015-07-08 NOTE — Telephone Encounter (Signed)
Called patient about her appointment. Patient stated she knows she has missed her appointment, but she does not have a car at this time. I asked her if she could take the bus, but she stated she could not. I explained to her the importance of being seen. For the health and well being for her unborn child. Patient stated she understood, and will call when she finds a car.

## 2015-07-14 ENCOUNTER — Ambulatory Visit (HOSPITAL_COMMUNITY): Payer: Self-pay

## 2015-07-15 ENCOUNTER — Encounter: Payer: Self-pay | Admitting: Family Medicine

## 2015-10-01 ENCOUNTER — Ambulatory Visit (INDEPENDENT_AMBULATORY_CARE_PROVIDER_SITE_OTHER): Payer: Self-pay | Admitting: Family Medicine

## 2015-10-01 VITALS — BP 110/66 | HR 79 | Wt 219.4 lb

## 2015-10-01 DIAGNOSIS — O0933 Supervision of pregnancy with insufficient antenatal care, third trimester: Secondary | ICD-10-CM

## 2015-10-01 DIAGNOSIS — Z23 Encounter for immunization: Secondary | ICD-10-CM

## 2015-10-01 DIAGNOSIS — O0992 Supervision of high risk pregnancy, unspecified, second trimester: Secondary | ICD-10-CM

## 2015-10-01 DIAGNOSIS — O09293 Supervision of pregnancy with other poor reproductive or obstetric history, third trimester: Secondary | ICD-10-CM

## 2015-10-01 DIAGNOSIS — Z8632 Personal history of gestational diabetes: Secondary | ICD-10-CM

## 2015-10-01 DIAGNOSIS — E282 Polycystic ovarian syndrome: Secondary | ICD-10-CM

## 2015-10-01 DIAGNOSIS — Z113 Encounter for screening for infections with a predominantly sexual mode of transmission: Secondary | ICD-10-CM

## 2015-10-01 LAB — CBC
HCT: 32.9 % — ABNORMAL LOW (ref 35.0–45.0)
HEMOGLOBIN: 10 g/dL — AB (ref 11.7–15.5)
MCH: 21.7 pg — ABNORMAL LOW (ref 27.0–33.0)
MCHC: 30.4 g/dL — AB (ref 32.0–36.0)
MCV: 71.4 fL — ABNORMAL LOW (ref 80.0–100.0)
MPV: 9.7 fL (ref 7.5–12.5)
PLATELETS: 311 10*3/uL (ref 140–400)
RBC: 4.61 MIL/uL (ref 3.80–5.10)
RDW: 17 % — AB (ref 11.0–15.0)
WBC: 6.6 10*3/uL (ref 3.8–10.8)

## 2015-10-01 LAB — OB RESULTS CONSOLE GBS: STREP GROUP B AG: NEGATIVE

## 2015-10-01 MED ORDER — TETANUS-DIPHTH-ACELL PERTUSSIS 5-2.5-18.5 LF-MCG/0.5 IM SUSP
0.5000 mL | Freq: Once | INTRAMUSCULAR | Status: AC
  Administered 2015-10-01: 0.5 mL via INTRAMUSCULAR

## 2015-10-01 NOTE — Progress Notes (Signed)
Subjective:  Rexine Balboa is a 25 y.o. G3P2002 at 6780w3d being Gerre Scullseen today for ongoing prenatal care.  She is currently monitored for the following issues for this high-risk pregnancy and has Hirsutism; Weight gain; PCOS (polycystic ovarian syndrome); Supervision of high risk pregnancy in second trimester; Prior pregnancy complicated by SGA (small for gestational age), antepartum; and History of gestational diabetes on her problem list.  Patient reports no complaints. Patient with lapse in care since 20 weeks (~16 weeks). Patient will get third trimester labs today.  Contractions: Not present. Vag. Bleeding: None.  Movement: Present. Denies leaking of fluid.   The following portions of the patient's history were reviewed and updated as appropriate: allergies, current medications, past family history, past medical history, past social history, past surgical history and problem list. Problem list updated.  Objective:   Filed Vitals:   10/01/15 1103  BP: 110/66  Pulse: 79  Weight: 219 lb 6.4 oz (99.519 kg)    Fetal Status: Fetal Heart Rate (bpm): 142   Movement: Present     General:  Alert, oriented and cooperative. Patient is in no acute distress.  Skin: Skin is warm and dry. No rash noted.   Cardiovascular: Normal heart rate noted  Respiratory: Normal respiratory effort, no problems with respiration noted  Abdomen: Soft, gravid, appropriate for gestational age. Pain/Pressure: Absent     Pelvic: Vag. Bleeding: None     Cervical exam deferred        Extremities: Normal range of motion.  Edema: Trace  Mental Status: Normal mood and affect. Normal behavior. Normal judgment and thought content.   Urinalysis:      Assessment and Plan:  Pregnancy: G3P2002 at 5380w3d  1. History of gestational diabetes - Early 1 hr was negative  2. Supervision of high risk pregnancy in second trimester - Culture, beta strep (group b only) - GC/Chlamydia probe amp ([place])not [facility] - CBC - RPR -  HIV antibody (with reflex) - Tdap (BOOSTRIX) injection 0.5 mL; Inject 0.5 mLs into the muscle once. - Lengthy discussion about contraception. Desires BTS but no insurance. Discussed Mirena which would be good option givne risk of endometrial hyperplasia from PCOS  3. Lapse in care for 16 weeks - patient without medicaid and unable to afford visits.  - Discussed needing to update care - Currently I worry about her fundal height being S>D and will order US, expedited fashion and discussed with the patient the need for this US esp in the setting of cost  Preterm labor symptoms and general obstetric precautions including but not limited to vaginal bleeding, contractions, leaking of fluid and fetal movement were reviewed in detail with the patient. Please refer to After Visit Summary for other counseling recommendations.  Return in about 1 week (around 10/08/2015) for Routine prenatal care.   Federico FlakeKimberly Niles Amylee Lodato, MD

## 2015-10-02 LAB — GLUCOSE TOLERANCE, 1 HOUR (50G) W/O FASTING: GLUCOSE, 1 HR, GESTATIONAL: 120 mg/dL (ref <140)

## 2015-10-02 LAB — HIV ANTIBODY (ROUTINE TESTING W REFLEX): HIV: NONREACTIVE

## 2015-10-02 LAB — GC/CHLAMYDIA PROBE AMP (~~LOC~~) NOT AT ARMC
CHLAMYDIA, DNA PROBE: NEGATIVE
Neisseria Gonorrhea: NEGATIVE

## 2015-10-02 LAB — RPR

## 2015-10-03 LAB — CULTURE, BETA STREP (GROUP B ONLY)

## 2015-10-07 ENCOUNTER — Ambulatory Visit (HOSPITAL_COMMUNITY): Admission: RE | Admit: 2015-10-07 | Payer: Self-pay | Source: Ambulatory Visit

## 2015-10-08 ENCOUNTER — Encounter: Payer: Self-pay | Admitting: Family Medicine

## 2015-10-14 ENCOUNTER — Inpatient Hospital Stay (HOSPITAL_COMMUNITY): Admission: RE | Admit: 2015-10-14 | Payer: Self-pay | Source: Ambulatory Visit

## 2015-10-14 ENCOUNTER — Encounter (HOSPITAL_COMMUNITY): Payer: Self-pay

## 2015-10-14 ENCOUNTER — Inpatient Hospital Stay (HOSPITAL_COMMUNITY)
Admission: AD | Admit: 2015-10-14 | Discharge: 2015-10-16 | DRG: 775 | Disposition: A | Discharge status: Home / Self Care | Payer: Medicaid Other | Source: Ambulatory Visit | Attending: Obstetrics & Gynecology | Admitting: Obstetrics & Gynecology

## 2015-10-14 DIAGNOSIS — E282 Polycystic ovarian syndrome: Secondary | ICD-10-CM | POA: Diagnosis present

## 2015-10-14 DIAGNOSIS — Z825 Family history of asthma and other chronic lower respiratory diseases: Secondary | ICD-10-CM

## 2015-10-14 DIAGNOSIS — Z8632 Personal history of gestational diabetes: Secondary | ICD-10-CM

## 2015-10-14 DIAGNOSIS — O09293 Supervision of pregnancy with other poor reproductive or obstetric history, third trimester: Secondary | ICD-10-CM

## 2015-10-14 DIAGNOSIS — IMO0001 Reserved for inherently not codable concepts without codable children: Secondary | ICD-10-CM

## 2015-10-14 DIAGNOSIS — Z3A38 38 weeks gestation of pregnancy: Secondary | ICD-10-CM

## 2015-10-14 LAB — CBC
HCT: 32.5 % — ABNORMAL LOW (ref 36.0–46.0)
Hemoglobin: 10.2 g/dL — ABNORMAL LOW (ref 12.0–15.0)
MCH: 21.7 pg — ABNORMAL LOW (ref 26.0–34.0)
MCHC: 31.4 g/dL (ref 30.0–36.0)
MCV: 69.1 fL — AB (ref 78.0–100.0)
PLATELETS: 306 10*3/uL (ref 150–400)
RBC: 4.7 MIL/uL (ref 3.87–5.11)
RDW: 17 % — AB (ref 11.5–15.5)
WBC: 12.1 10*3/uL — AB (ref 4.0–10.5)

## 2015-10-14 LAB — TYPE AND SCREEN
ABO/RH(D): A POS
ANTIBODY SCREEN: NEGATIVE

## 2015-10-14 MED ORDER — ONDANSETRON HCL 4 MG/2ML IJ SOLN: 4.0000 mg | Freq: Four times a day (QID) | INTRAMUSCULAR | Status: DC | PRN

## 2015-10-14 MED ORDER — OXYCODONE-ACETAMINOPHEN 5-325 MG PO TABS: 2.0000 | ORAL_TABLET | ORAL | Status: DC | PRN

## 2015-10-14 MED ORDER — SOD CITRATE-CITRIC ACID 500-334 MG/5ML PO SOLN: 30.0000 mL | ORAL | Status: DC | PRN

## 2015-10-14 MED ORDER — OXYCODONE-ACETAMINOPHEN 5-325 MG PO TABS: 1.0000 | ORAL_TABLET | ORAL | Status: DC | PRN

## 2015-10-14 MED ORDER — OXYTOCIN 40 UNITS IN LACTATED RINGERS INFUSION - SIMPLE MED
2.5000 [IU]/h | INTRAVENOUS | Status: DC
  Filled 2015-10-14: qty 1000

## 2015-10-14 MED ORDER — OXYTOCIN BOLUS FROM INFUSION
500.0000 mL | INTRAVENOUS | Status: DC
  Administered 2015-10-15: 500 mL via INTRAVENOUS

## 2015-10-14 MED ORDER — ACETAMINOPHEN 325 MG PO TABS: 650.0000 mg | ORAL_TABLET | ORAL | Status: DC | PRN

## 2015-10-14 MED ORDER — LACTATED RINGERS IV SOLN: 500.0000 mL | INTRAVENOUS | Status: DC | PRN

## 2015-10-14 MED ORDER — LACTATED RINGERS IV SOLN
INTRAVENOUS | Status: DC
  Administered 2015-10-14: 22:00:00 via INTRAVENOUS

## 2015-10-14 MED ORDER — LIDOCAINE HCL (PF) 1 % IJ SOLN
30.0000 mL | INTRAMUSCULAR | Status: DC | PRN
  Filled 2015-10-14: qty 30

## 2015-10-14 MED ORDER — FENTANYL CITRATE (PF) 100 MCG/2ML IJ SOLN: 100.0000 ug | INTRAMUSCULAR | Status: DC | PRN

## 2015-10-14 NOTE — MAU Note (Signed)
Pt c/o contractions that started yesterday-irregular. Today they are every 8-10 mins. Having some bloody show. Denies LOF. +FM. Has not had SVE in office recently.

## 2015-10-14 NOTE — MAU Note (Signed)
Notified provider that patient is 6.5/70/-2. Provider said to admit patient.

## 2015-10-14 NOTE — MAU Note (Signed)
Notified provider that patient is here for a labor eval. Patient is 5.5/70/-2. Fetus active. Provider said to recheck patient in 30 minutes.

## 2015-10-14 NOTE — H&P (Signed)
Bridget Baird is a 25 y.o. female G3P2002 @ 38.2wks presenting for reg ctx. Denies leaking or bldg; reports +FM. [facility]) and has been remarkable for 1) PCOS 2) hx GDM, thought to be B at the time 3) 16wk lapse of care this preg resulting in visits at 14, 20, and 36wks. Glucola nl x 2 this preg. She measured S>D x 2 weeks ago and was supposed to have an U/S, but results not found (prev babies 5 and 6 lbs).  History OB History    Gravida Para Term Preterm AB TAB SAB Ectopic Multiple Living   3 2 2  0 0 0 0 0 0 2     Past Medical History  Diagnosis Date  . Gestational diabetes     with 11/08/14 pregnancy   Past Surgical History  Procedure Laterality Date  . No past surgeries     Family History: family history includes Asthma in her daughter; Breast cancer in her paternal aunt. Social History:  reports that she has never smoked. She has never used smokeless tobacco. She reports that she does not drink alcohol or use illicit drugs.   Prenatal Transfer Tool  Maternal Diabetes: No Genetic Screening: Normal Maternal Ultrasounds/Referrals: Normal Fetal Ultrasounds or other Referrals:  None Maternal Substance Abuse:  No Significant Maternal Medications:  None Significant Maternal Lab Results:  Lab values include: Group B Strep negative Other Comments:  limited prenatal visits @ 14, 20, 36 wks  ROS  Dilation: 6.5 Effacement (%): 70 Station: -2 Exam by:: Kayren EavesAshley Garvey RN  Blood pressure 122/60, pulse 82, temperature 98.5 F (36.9 C), temperature source Oral, resp. rate 16, height 5\' 5"  (1.651 m), weight 99.791 kg (220 lb), last menstrual period 12/09/2014, SpO2 97 %, not currently breastfeeding. Exam Physical Exam  Constitutional: She is oriented to person, place, and time. She appears well-developed.  HENT:  Head: Normocephalic.  Neck: Normal range of motion.  Cardiovascular: Normal rate.   Respiratory: Effort normal.   GI:  FHR 120-130s, +accels, no decels Ctx q 3-4 mins  Musculoskeletal: Normal range of motion.  Neurological: She is alert and oriented to person, place, and time.  Skin: Skin is warm and dry.  Psychiatric: She has a normal mood and affect. Her behavior is normal. Thought content normal.    Prenatal labs: ABO, Rh: --/--/A POS (06/21 2222) Antibody: PENDING (06/21 2222) Rubella: Immune (12/05 0000) RPR: NON REAC (06/08 1251)  HBsAg: Negative (12/05 0000)  HIV: NONREACTIVE (06/08 1251)  GBS: Negative (06/08 0000)   Assessment/Plan: IUP@38 .2wks Active labor GBS neg  [facility] Anticipate SVD   Cam HaiSHAW, Terri Rorrer 10/14/2015, 11:16 PM

## 2015-10-15 ENCOUNTER — Encounter (HOSPITAL_COMMUNITY): Payer: Self-pay | Admitting: Certified Nurse Midwife

## 2015-10-15 DIAGNOSIS — Z3A38 38 weeks gestation of pregnancy: Secondary | ICD-10-CM

## 2015-10-15 LAB — RPR: RPR: NONREACTIVE

## 2015-10-15 MED ORDER — ACETAMINOPHEN 325 MG PO TABS: 650.0000 mg | ORAL_TABLET | ORAL | Status: DC | PRN

## 2015-10-15 MED ORDER — IBUPROFEN 600 MG PO TABS
600.0000 mg | ORAL_TABLET | Freq: Four times a day (QID) | ORAL | Status: DC
  Administered 2015-10-15 – 2015-10-16 (×6): 600 mg via ORAL
  Filled 2015-10-15 (×6): qty 1

## 2015-10-15 MED ORDER — DIBUCAINE 1 % RE OINT: 1.0000 "application " | TOPICAL_OINTMENT | RECTAL | Status: DC | PRN

## 2015-10-15 MED ORDER — TETANUS-DIPHTH-ACELL PERTUSSIS 5-2.5-18.5 LF-MCG/0.5 IM SUSP: 0.5000 mL | Freq: Once | INTRAMUSCULAR | Status: DC

## 2015-10-15 MED ORDER — COCONUT OIL OIL: 1.0000 "application " | TOPICAL_OIL | Status: DC | PRN

## 2015-10-15 MED ORDER — ONDANSETRON HCL 4 MG PO TABS: 4.0000 mg | ORAL_TABLET | ORAL | Status: DC | PRN

## 2015-10-15 MED ORDER — SENNOSIDES-DOCUSATE SODIUM 8.6-50 MG PO TABS
2.0000 | ORAL_TABLET | ORAL | Status: DC
  Administered 2015-10-15: 2 via ORAL
  Filled 2015-10-15: qty 2

## 2015-10-15 MED ORDER — SIMETHICONE 80 MG PO CHEW: 80.0000 mg | CHEWABLE_TABLET | ORAL | Status: DC | PRN

## 2015-10-15 MED ORDER — WITCH HAZEL-GLYCERIN EX PADS: 1.0000 "application " | MEDICATED_PAD | CUTANEOUS | Status: DC | PRN

## 2015-10-15 MED ORDER — DIPHENHYDRAMINE HCL 25 MG PO CAPS: 25.0000 mg | ORAL_CAPSULE | Freq: Four times a day (QID) | ORAL | Status: DC | PRN

## 2015-10-15 MED ORDER — PRENATAL MULTIVITAMIN CH
1.0000 | ORAL_TABLET | Freq: Every day | ORAL | Status: DC
  Administered 2015-10-15 – 2015-10-16 (×2): 1 via ORAL
  Filled 2015-10-15 (×2): qty 1

## 2015-10-15 MED ORDER — BENZOCAINE-MENTHOL 20-0.5 % EX AERO: 1.0000 "application " | INHALATION_SPRAY | CUTANEOUS | Status: DC | PRN

## 2015-10-15 MED ORDER — ZOLPIDEM TARTRATE 5 MG PO TABS: 5.0000 mg | ORAL_TABLET | Freq: Every evening | ORAL | Status: DC | PRN

## 2015-10-15 MED ORDER — ONDANSETRON HCL 4 MG/2ML IJ SOLN: 4.0000 mg | INTRAMUSCULAR | Status: DC | PRN

## 2015-10-15 NOTE — Progress Notes (Signed)
UR chart review completed.  

## 2015-10-16 MED ORDER — IBUPROFEN 600 MG PO TABS: 600.0000 mg | ORAL_TABLET | Freq: Four times a day (QID) | ORAL | Status: AC | PRN

## 2015-10-16 NOTE — Progress Notes (Signed)
CSW received consult for limited PNC.  CSW notes that care was established at 10 weeks and that she had more than three visits, thus not qualifying for automatic CSW consult.  CSW aware of lapse in care with documentation stating she lost Medicaid for a period of time and could not afford visits.  CSW is screening out at this time.  Please call CSW with current concerns or if acute needs arise. 

## 2015-10-16 NOTE — Discharge Instructions (Signed)

## 2015-10-16 NOTE — Discharge Summary (Signed)
OB Discharge Summary     Patient Name: Bridget Baird DOB: 11/23/1990 MRN: [id number]  Date of admission: 10/14/2015 Delivering MD: Cam HaiSHAW, KIMBERLY D   Date of discharge: 10/16/2015  Admitting diagnosis: 38w ctx 8-10 min Intrauterine pregnancy: 2654w3d     Secondary diagnosis:  Active Problems:   PCOS (polycystic ovarian syndrome)   Active labor at term   SVD (spontaneous vaginal delivery)  Additional problems: none     Discharge diagnosis: Term Pregnancy Delivered                                                                                                Post partum procedures:none  Augmentation: none  Complications: None  Hospital course:  Onset of Labor With Vaginal Delivery     25 y.o. yo G3P3003 at 7254w3d was admitted in Active Labor on 10/14/2015. Patient had an uncomplicated labor course as follows:  Membrane Rupture Time/Date: 4:06 AM ,10/15/2015   Intrapartum Procedures: Episiotomy: None [1]                                         Lacerations:  None [1]  Patient had a delivery of a Viable infant. 10/15/2015  Information for the patient's newborn:  Huey BienenstockHernandez, Girl Aleeza [[id number]][[id number]]  Delivery Method: Vaginal, Spontaneous Delivery (Filed from Delivery Summary)     Pateint had an uncomplicated postpartum course.  She is ambulating, tolerating a regular diet, passing flatus, and urinating well. Patient is discharged home in stable condition on 10/16/2015.  Physical exam  Filed Vitals:   10/15/15 0630 10/15/15 1029 10/15/15 1740 10/16/15 0623  BP: 99/50 106/56 103/55 99/54  Pulse: 64 63 65 57  Temp: 98.3 F (36.8 C) 98.5 F (36.9 C) 98.1 F (36.7 C) 98 F (36.7 C)  TempSrc: Oral Oral Oral Oral  Resp: 18 18 18 18   Height:      Weight:      SpO2:       General: alert, cooperative and no distress Lochia: appropriate Uterine Fundus: firm Incision: N/A DVT Evaluation: No evidence of DVT seen on physical exam. Labs: Lab Results  Component Value Date   WBC  12.1* 10/14/2015   HGB 10.2* 10/14/2015   HCT 32.5* 10/14/2015   MCV 69.1* 10/14/2015   PLT 306 10/14/2015   CMP Latest Ref Rng 06/16/2014  Glucose 70 - 99 mg/dL 75  BUN 6 - 23 mg/dL 8  Creatinine 7.820.50 - 9.561.10 mg/dL 2.130.52  Sodium 086135 - 578145 mEq/L 136  Potassium 3.5 - 5.3 mEq/L 3.7  Chloride 96 - 112 mEq/L 104  CO2 19 - 32 mEq/L 21  Calcium 8.4 - 10.5 mg/dL 9.1  Total Protein 6.0 - 8.3 g/dL 6.5  Total Bilirubin 0.2 - 1.2 mg/dL 0.2  Alkaline Phos 39 - 117 U/L 48  AST 0 - 37 U/L 13  ALT 0 - 35 U/L 15    Discharge instruction: per After Visit Summary and "Baby and Me Booklet".  After visit meds:    Medication  List    ASK your doctor about these medications        PRENATAL VITAMIN PO  Take 1 tablet by mouth daily.        Diet: routine diet  Activity: Advance as tolerated. Pelvic rest for 6 weeks.   Outpatient follow up:6 weeks Follow-up Information    [facility] weeks.   Specialty:  Obstetrics and Gynecology   Why:  Postpartum check   Contact information:   [address] [place] [id number] [phone]      Postpartum contraception: IUD Mirena  Newborn Data: Live born female  Birth Weight: 5 lb 14.7 oz (2685 g) APGAR: 8, 9  Baby Feeding: Bottle and Breast Disposition:home with mother   10/16/2015 Almon Herculesaye T Gonfa, MD   I have seen and examined this patient and agree the above assessment.  Respiratory effort normal, lochia appropriate, legs negative,  pain level normal.  CRESENZO-DISHMAN,Magdalene Tardiff 10/17/2015 10:08 AM

## 2015-11-26 ENCOUNTER — Encounter: Payer: Self-pay | Admitting: Obstetrics & Gynecology

## 2015-11-26 ENCOUNTER — Ambulatory Visit (INDEPENDENT_AMBULATORY_CARE_PROVIDER_SITE_OTHER): Payer: Medicaid Other | Admitting: Obstetrics & Gynecology

## 2015-11-26 VITALS — BP 98/55 | HR 63 | Ht 63.75 in | Wt 209.4 lb

## 2015-11-26 DIAGNOSIS — E282 Polycystic ovarian syndrome: Secondary | ICD-10-CM

## 2015-11-26 DIAGNOSIS — Z30011 Encounter for initial prescription of contraceptive pills: Secondary | ICD-10-CM

## 2015-11-26 LAB — POCT PREGNANCY, URINE: PREG TEST UR: NEGATIVE

## 2015-11-26 MED ORDER — NORGESTIM-ETH ESTRAD TRIPHASIC 0.18/0.215/0.25 MG-25 MCG PO TABS: 1.0000 | ORAL_TABLET | Freq: Every day | ORAL | 11 refills | Status: AC

## 2015-11-26 NOTE — Progress Notes (Signed)
Would like to get birth control that helps with PCOS- gets facial hair when not on pills.

## 2015-11-26 NOTE — Progress Notes (Signed)
Subjective:     Bridget Baird is a 25 y.o. female who presents for a postpartum visit. She is 6 weeks postpartum following a spontaneous vaginal delivery. I have fully reviewed the prenatal and intrapartum course. The delivery was at **38 gestational weeks. Outcome: spontaneous vaginal delivery. Anesthesia: none. Postpartum course has been normal. Baby's course has been good. Baby is feeding by bottle - Similac Advance. Bleeding current menses. Bowel function is normal. Bladder function is normal. Patient is sexually active. Contraception method is OCP (estrogen/progesterone). Postpartum depression screening: negative.  The following portions of the patient's history were reviewed and updated as appropriate: allergies, current medications, past family history, past medical history, past social history, past surgical history and problem list.  Review of Systems Pertinent items are noted in HPI.   Objective:    There were no vitals taken for this visit.  General:  alert, cooperative and no distress           Abdomen: soft, non-tender; bowel sounds normal; no masses,  no organomegaly   Vulva:  not evaluated  Vagina: not evaluated  Cervix:     Corpus: not examined  Adnexa:  not evaluated  Rectal Exam: Not performed.        Assessment:     normal postpartum exam. Pap smear not done at today's visit.   Plan:    1. Contraception: OCP (estrogen/progesterone) 2. May start her pills 3. Follow up as needed.    Adam Phenix, MD 11/26/2015

## 2016-09-07 ENCOUNTER — Encounter: Payer: Self-pay | Admitting: Gynecology

## 2017-06-20 IMAGING — US US OB COMP LESS 14 WK
1 series · 15 of 28 positions shown · non-contrast
Comparison: 03/27/2015

CLINICAL DATA: Followup subchorionic hemorrhage. Evaluate for
viability.

EXAM:
OBSTETRIC <14 WK ULTRASOUND
TECHNIQUE: Transabdominal ultrasound was performed for evaluation of the
gestation as well as the maternal uterus and adnexal regions.

[Series 1: us ob comp less 14 wk · 56 acquisitions, 15 frames shown]
[im 1/56]
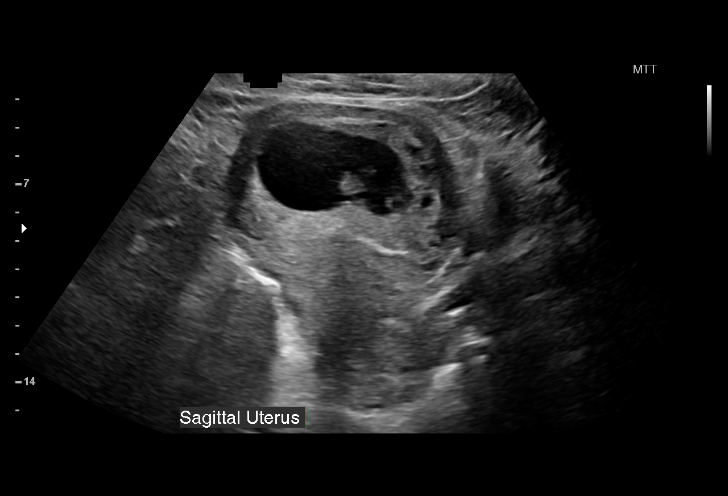
[im 5/56]
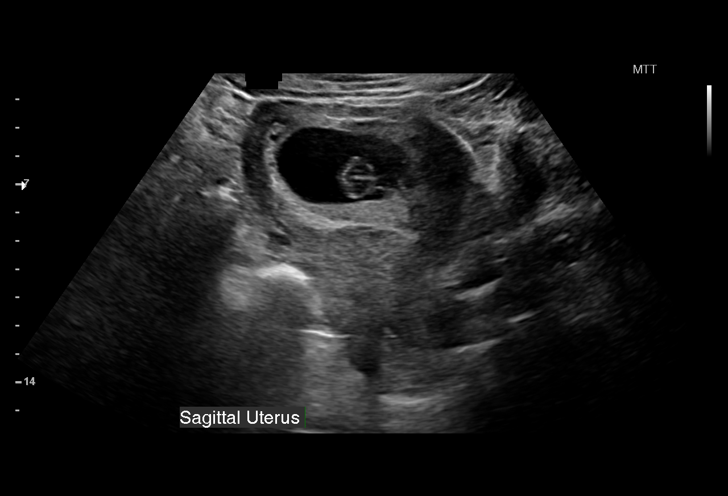
[im 9/56]
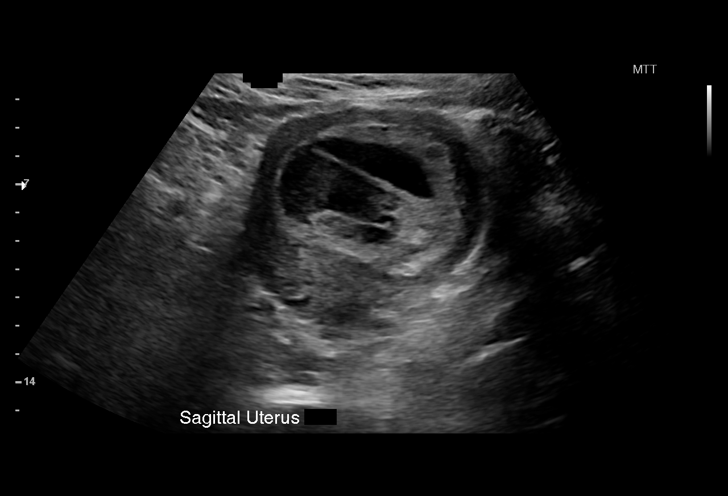
[im 13/56]
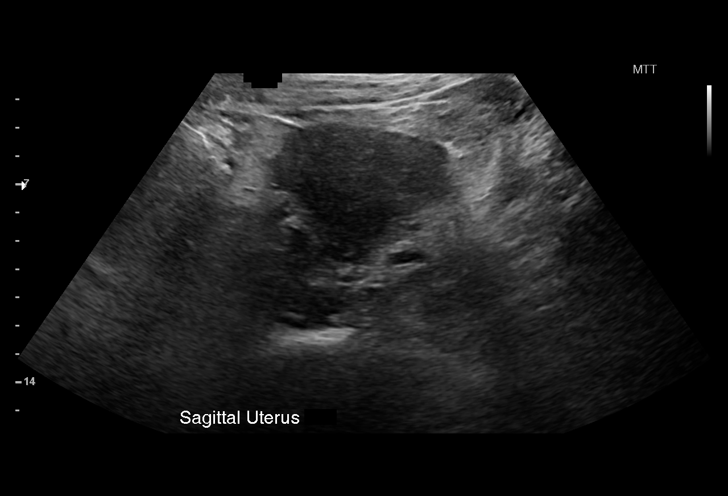
[im 17/56]
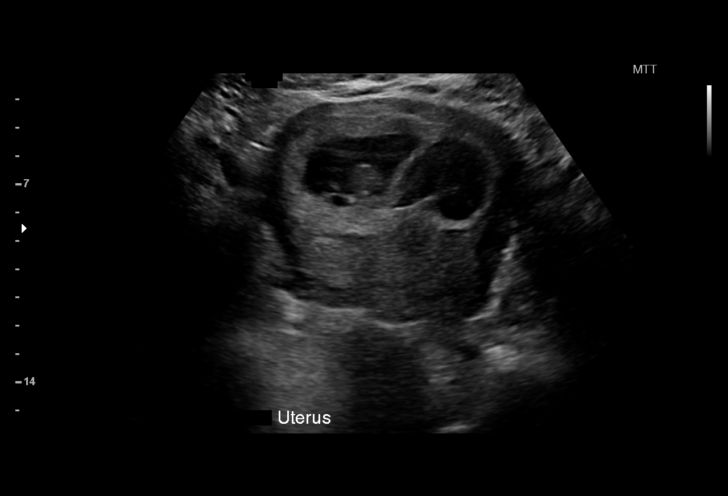
[im 21/56]
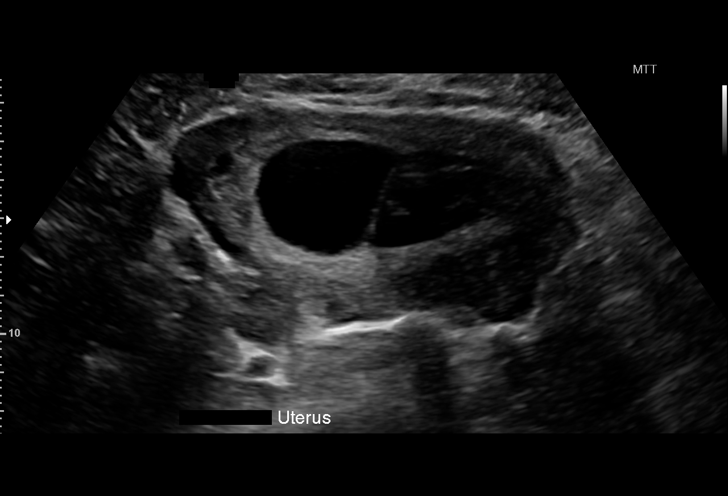
[im 25/56]
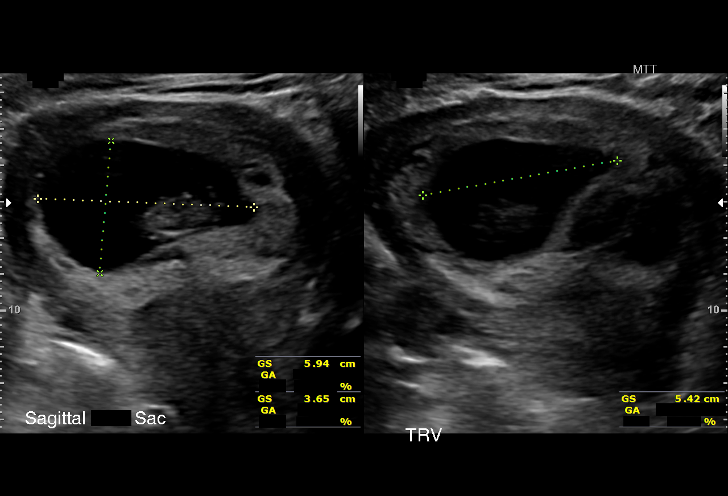
[im 29/56]
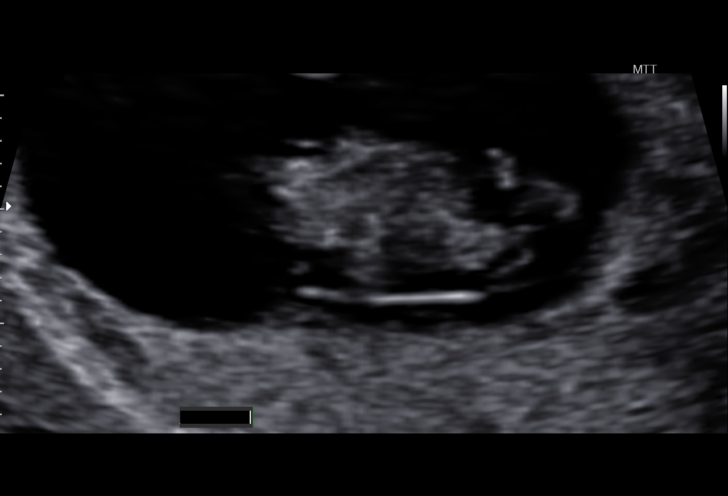
[im 31/56]
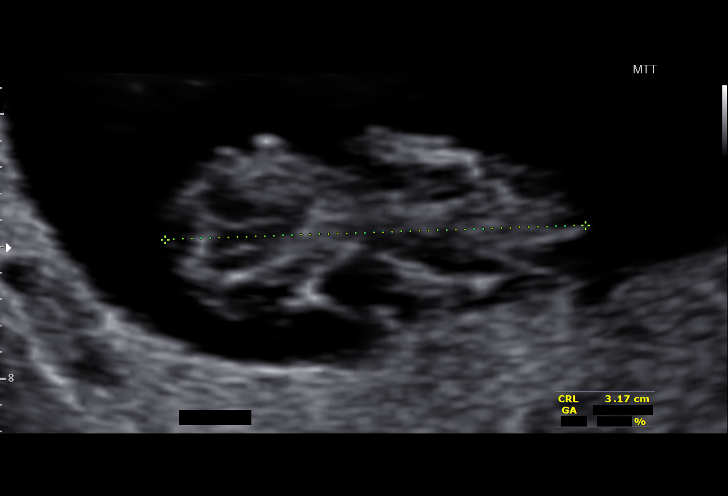
[im 35/56]
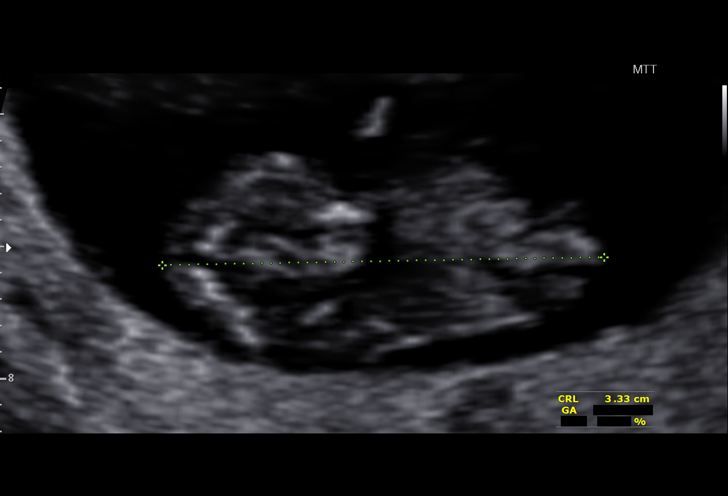
[im 39/56]
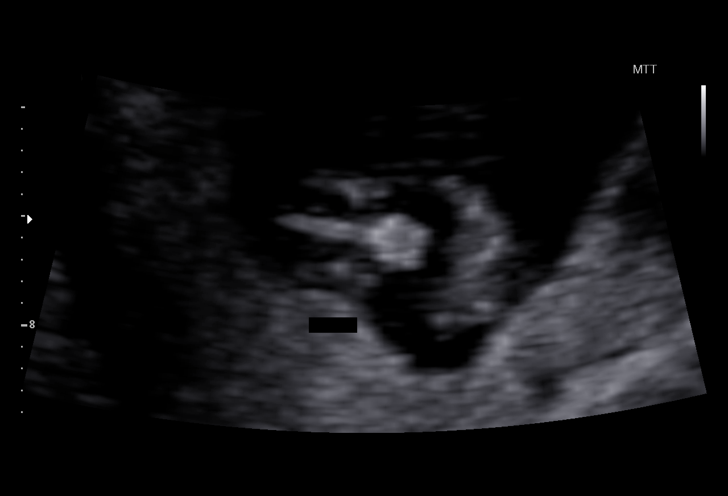
[im 43/56]
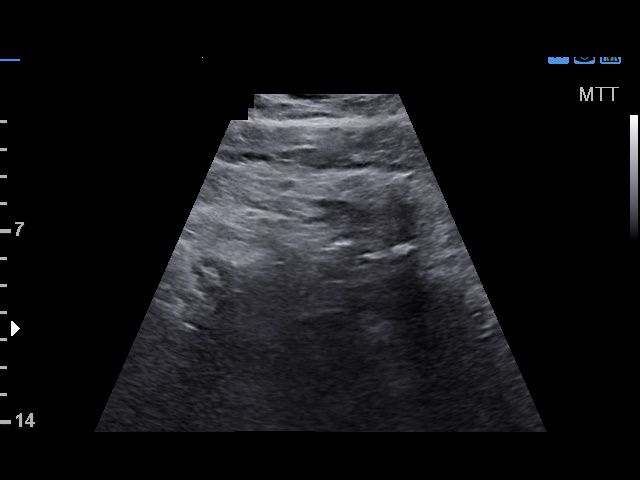
[im 47/56]
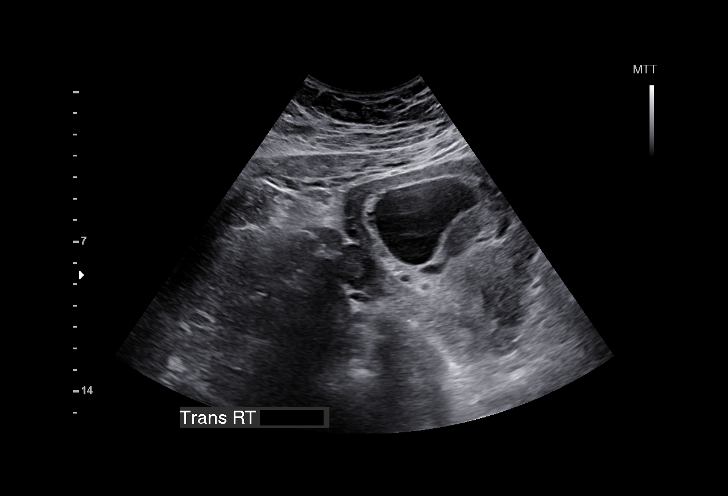
[im 51/56]
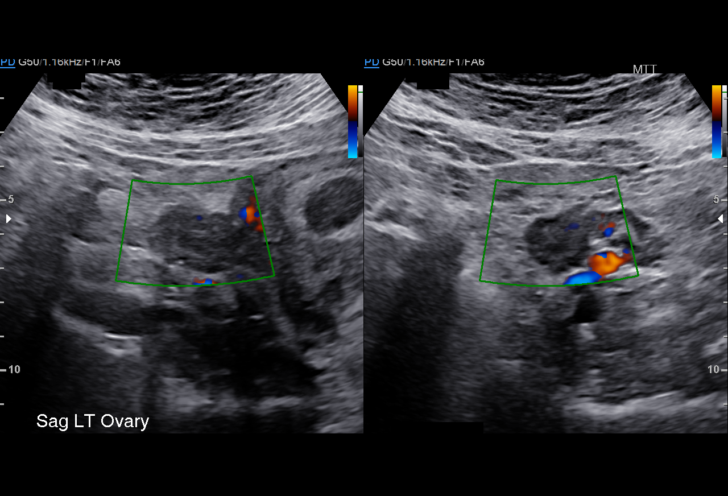
[im 56/56]
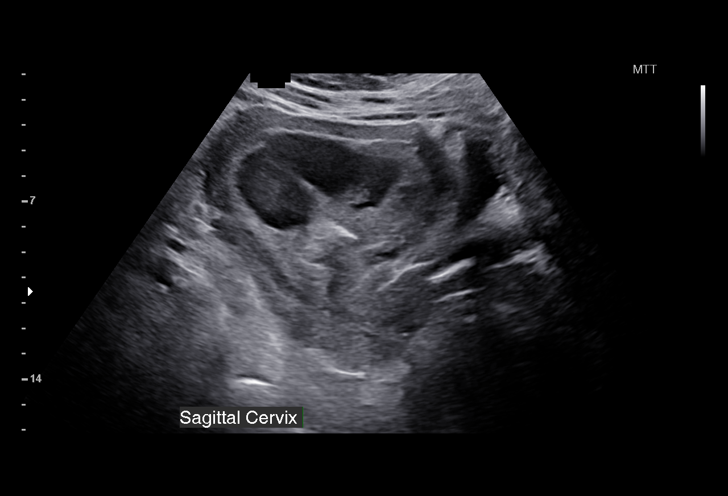

[15 of 28 positions shown; findings below may reference images not displayed]

FINDINGS: Intrauterine gestational sac: Visualized/normal in shape.

Yolk sac:  Not visualized, previously visualized.

Embryo:  Visualized

Cardiac Activity: Visualized

Heart Rate: 162 bpm

MSD:   mm    w     d

CRL:   32.3  mm   10 w 1 d                  US EDC: 10/26/2015

Maternal uterus/adnexae: Moderate-sized subchorionic hemorrhage is
again noted, similar or slightly smaller than prior study.
Subchorionic hemorrhage is more liquified on today's study. Ovaries
symmetric in size and echotexture. No adnexal masses.
IMPRESSION: Continued moderate subchorionic hemorrhage, now liquified. Stable or
slightly smaller in overall size.

Ten week 1 day intrauterine pregnancy with fetal heart rate 162
beats per minute.

## 2020-01-02 ENCOUNTER — Other Ambulatory Visit: Payer: Self-pay

## 2020-01-02 DIAGNOSIS — Z20822 Contact with and (suspected) exposure to covid-19: Secondary | ICD-10-CM

## 2020-01-04 LAB — SARS-COV-2, NAA 2 DAY TAT

## 2020-01-04 LAB — NOVEL CORONAVIRUS, NAA: SARS-CoV-2, NAA: DETECTED — AB

## 2020-01-05 ENCOUNTER — Telehealth: Payer: Self-pay | Admitting: Nurse Practitioner

## 2020-01-05 NOTE — Telephone Encounter (Signed)
I called Gerre Scull to discuss Covid symptoms and the use of casirivimab/imdevimab, a monoclonal antibody infusion for those with mild to moderate Covid symptoms and at a high risk of hospitalization.  A Spanish interpreter was used for the duration of the call.   Pt is qualified for this infusion at the monoclonal antibody infusion center due to co-morbid conditions and/or a member of an at-risk group, however declines infusion at this time. Symptoms tier reviewed as well as criteria for ending isolation.  Symptoms reviewed that would warrant ED/Hospital evaluation. Preventative practices reviewed. Patient verbalized understanding. Patient advised to call back if he decides that he does want to get infusion. Callback number to the infusion center given. Patient advised to go to Urgent care or ED with severe symptoms. Last date pt would be eligible for infusion is 9/13.    Nicolasa Ducking, NP

## 2021-06-17 ENCOUNTER — Encounter (HOSPITAL_COMMUNITY): Payer: Self-pay

## 2021-06-17 ENCOUNTER — Ambulatory Visit (HOSPITAL_COMMUNITY)
Admission: EM | Admit: 2021-06-17 | Discharge: 2021-06-17 | Disposition: A | Discharge status: Home / Self Care | Payer: Self-pay | Attending: Family Medicine | Admitting: Family Medicine

## 2021-06-17 ENCOUNTER — Other Ambulatory Visit: Payer: Self-pay

## 2021-06-17 DIAGNOSIS — R42 Dizziness and giddiness: Secondary | ICD-10-CM

## 2021-06-17 LAB — CBG MONITORING, ED: Glucose-Capillary: 91 mg/dL (ref 70–99)

## 2021-06-17 NOTE — ED Provider Notes (Signed)
[facility]    CSN: [id number] Arrival date & time: 06/17/21  0941      History   Chief Complaint Chief Complaint  Patient presents with   Dizziness    HPI Bridget Baird is a 31 y.o. female.   Patient is here for dizziness.  She started with this 2 months ago.  In the mornings she is dizzy, and cannot see very well.  It lasts about 3 minutes.  She just stands up and waits for it to pass.  Happens ever morning.  + nausea.   Room is spinning at times.  Sometimes will happen in the afternoon.  No uri symptoms.  No ear pain. This happens before she eats breakfast.  She usually eats lunch at noon, but if she doesn't eat until 2pm she also feels dizzy.  She states she does not have h/o gestational diabetes.  No food today.    Past Medical History:  Diagnosis Date   Gestational diabetes    with 11/08/14 pregnancy    Patient Active Problem List   Diagnosis Date Noted   History of gestational diabetes 04/28/2015   PCOS (polycystic ovarian syndrome) 01/21/2014   Hirsutism 01/01/2014    Past Surgical History:  Procedure Laterality Date   NO PAST SURGERIES      OB History     Gravida  3   Para  3   Term  3   Preterm  0   AB  0   Living  3      SAB  0   IAB  0   Ectopic  0   Multiple  0   Live Births  3            Home Medications    Prior to Admission medications   Medication Sig Start Date End Date Taking? Authorizing Provider  ibuprofen (ADVIL,MOTRIN) 600 MG tablet Take 1 tablet (600 mg total) by mouth every 6 (six) hours as needed. 10/16/15   Arabella Merles, CNM  Norgestimate-Ethinyl Estradiol Triphasic 0.18/0.215/0.25 MG-25 MCG tab Take 1 tablet by mouth daily. 11/26/15   Adam Phenix, MD  Prenatal Vit-Fe Fumarate-FA (PRENATAL VITAMIN PO) Take 1 tablet by mouth daily.     [provider]    Family History Family History  Problem Relation Age of Onset   Breast cancer Paternal Aunt    Asthma Daughter      Social History Social History   Tobacco Use   Smoking status: Never   Smokeless tobacco: Never  Substance Use Topics   Alcohol use: No    Alcohol/week: 0.0 standard drinks   Drug use: No     Allergies   Patient has no known allergies.   Review of Systems Review of Systems  Constitutional:  Negative for activity change, appetite change and fatigue.  HENT: Negative.    Respiratory: Negative.    Cardiovascular: Negative.   Gastrointestinal:  Positive for nausea.  Genitourinary: Negative.   Neurological:  Positive for dizziness.    Physical Exam Triage Vital Signs ED Triage Vitals  Enc Vitals Group     BP 06/17/21 1041 105/71     Pulse Rate 06/17/21 1041 90     Resp 06/17/21 1041 18     Temp 06/17/21 1041 99 F (37.2 C)     Temp Source 06/17/21 1041 Oral     SpO2 06/17/21 1041 97 %     Weight --      Height --  Head Circumference --      Peak Flow --      Pain Score 06/17/21 1044 0     Pain Loc --      Pain Edu? --      Excl. in GC? --    No data found.  Updated Vital Signs BP 105/71 (BP Location: Left Arm)    Pulse 90    Temp 99 F (37.2 C) (Oral)    Resp 18    LMP 06/16/2021    SpO2 97%   Visual Acuity Right Eye Distance:   Left Eye Distance:   Bilateral Distance:    Right Eye Near:   Left Eye Near:    Bilateral Near:     Physical Exam Constitutional:      Appearance: Normal appearance.  HENT:     Head: Normocephalic and atraumatic.     Right Ear: Tympanic membrane normal.     Left Ear: Tympanic membrane normal.     Nose: Nose normal.  Cardiovascular:     Rate and Rhythm: Normal rate and regular rhythm.  Pulmonary:     Effort: Pulmonary effort is normal.     Breath sounds: Normal breath sounds.  Abdominal:     General: Bowel sounds are normal.     Palpations: Abdomen is soft.  Musculoskeletal:     Cervical back: Normal range of motion.  Neurological:     General: No focal deficit present.     Mental Status: She is alert and  oriented to person, place, and time.    Orthostatics 103/69 119/79 112/79  UC Treatments / Results  Labs (all labs ordered are listed, but only abnormal results are displayed) Labs Reviewed  CBG MONITORING, ED    EKG   Radiology No results found.  Procedures Procedures (including critical care time)  Medications Ordered in UC Medications - No data to display  Initial Impression / Assessment and Plan / UC Course  I have reviewed the triage vital signs and the nursing notes.  Pertinent labs & imaging results that were available during my care of the patient were reviewed by me and considered in my medical decision making (see chart for details).  Patient was seen today for intermittent dizziness the last two months.  Does not appear to be vertigo.  BP is normal.  Blood sugar is normal.  No symptoms currently.  I have advised her to f/u with her pcp for further care/work up.   Final Clinical Impressions(s) / UC Diagnoses   Final diagnoses:  Dizziness     Discharge Instructions      You were seen today for intermittent dizziness.  Your blood pressure is good, and your blood sugar was normal here.  The rest of your exam was normal.  Nothing emergent going on today.  I do want you to talk with your primary care provider about these symptoms.  If your symptoms worsen, or you loose consciousness, then please go to the ER for evaluation.     ED Prescriptions   None    PDMP not reviewed this encounter.   Jannifer Franklin, MD 06/17/21 1119

## 2021-06-17 NOTE — Discharge Instructions (Addendum)
You were seen today for intermittent dizziness.  Your blood pressure is good, and your blood sugar was normal here.  The rest of your exam was normal.  Nothing emergent going on today.  I do want you to talk with your primary care provider about these symptoms.  If your symptoms worsen, or you loose consciousness, then please go to the ER for evaluation.

## 2021-06-17 NOTE — ED Triage Notes (Signed)
Pt presents with some intermittent dizziness X 2 months ; pt states it is mostly in the morning.

## 2021-10-06 ENCOUNTER — Encounter: Payer: Self-pay | Admitting: Obstetrics and Gynecology

## 2022-03-28 ENCOUNTER — Encounter (HOSPITAL_COMMUNITY): Payer: Self-pay
